# Patient Record
Sex: Female | Born: 1950 | Race: White | Hispanic: No | Smoking: Former smoker
Health system: Southern US, Community
[De-identification: ages and names within clinical notes are randomized; demographics above are authoritative.]

## PROBLEM LIST (undated history)

## (undated) DIAGNOSIS — C801 Malignant (primary) neoplasm, unspecified: Secondary | ICD-10-CM

## (undated) DIAGNOSIS — E785 Hyperlipidemia, unspecified: Secondary | ICD-10-CM

## (undated) DIAGNOSIS — R570 Cardiogenic shock: Secondary | ICD-10-CM

## (undated) DIAGNOSIS — Z8543 Personal history of malignant neoplasm of ovary: Secondary | ICD-10-CM

## (undated) DIAGNOSIS — I5032 Chronic diastolic (congestive) heart failure: Secondary | ICD-10-CM

## (undated) DIAGNOSIS — I2510 Atherosclerotic heart disease of native coronary artery without angina pectoris: Secondary | ICD-10-CM

## (undated) DIAGNOSIS — N183 Chronic kidney disease, stage 3 unspecified: Secondary | ICD-10-CM

## (undated) DIAGNOSIS — J449 Chronic obstructive pulmonary disease, unspecified: Secondary | ICD-10-CM

## (undated) DIAGNOSIS — I714 Abdominal aortic aneurysm, without rupture, unspecified: Secondary | ICD-10-CM

## (undated) DIAGNOSIS — I219 Acute myocardial infarction, unspecified: Secondary | ICD-10-CM

## (undated) DIAGNOSIS — I10 Essential (primary) hypertension: Secondary | ICD-10-CM

## (undated) HISTORY — DX: Personal history of malignant neoplasm of ovary: Z85.43

## (undated) HISTORY — DX: Chronic obstructive pulmonary disease, unspecified: J44.9

## (undated) HISTORY — DX: Acute myocardial infarction, unspecified: I21.9

## (undated) HISTORY — DX: Abdominal aortic aneurysm, without rupture, unspecified: I71.40

## (undated) HISTORY — DX: Abdominal aortic aneurysm, without rupture: I71.4

## (undated) HISTORY — DX: Cardiogenic shock: R57.0

## (undated) HISTORY — DX: Hyperlipidemia, unspecified: E78.5

## (undated) HISTORY — DX: Essential (primary) hypertension: I10

## (undated) HISTORY — DX: Atherosclerotic heart disease of native coronary artery without angina pectoris: I25.10

---

## 1988-11-14 DIAGNOSIS — Z8543 Personal history of malignant neoplasm of ovary: Secondary | ICD-10-CM

## 1988-11-14 HISTORY — PX: TOTAL ABDOMINAL HYSTERECTOMY W/ BILATERAL SALPINGOOPHORECTOMY: SHX83

## 1988-11-14 HISTORY — DX: Personal history of malignant neoplasm of ovary: Z85.43

## 1999-04-17 ENCOUNTER — Emergency Department (HOSPITAL_COMMUNITY): Admission: EM | Admit: 1999-04-17 | Discharge: 1999-04-17 | Payer: Self-pay | Admitting: Emergency Medicine

## 2000-05-12 ENCOUNTER — Encounter: Payer: Self-pay | Admitting: Family Medicine

## 2000-05-12 ENCOUNTER — Inpatient Hospital Stay (HOSPITAL_COMMUNITY): Admission: EM | Admit: 2000-05-12 | Discharge: 2000-05-15 | Payer: Self-pay | Admitting: Internal Medicine

## 2002-11-14 HISTORY — PX: CARDIAC CATHETERIZATION: SHX172

## 2003-02-04 ENCOUNTER — Emergency Department (HOSPITAL_COMMUNITY): Admission: EM | Admit: 2003-02-04 | Discharge: 2003-02-04 | Payer: Self-pay | Admitting: Emergency Medicine

## 2003-02-04 ENCOUNTER — Encounter: Payer: Self-pay | Admitting: Emergency Medicine

## 2003-11-02 ENCOUNTER — Emergency Department (HOSPITAL_COMMUNITY): Admission: EM | Admit: 2003-11-02 | Discharge: 2003-11-02 | Payer: Self-pay | Admitting: *Deleted

## 2004-04-18 ENCOUNTER — Other Ambulatory Visit: Payer: Self-pay

## 2004-11-14 HISTORY — PX: CARDIAC CATHETERIZATION: SHX172

## 2013-04-14 ENCOUNTER — Emergency Department: Payer: Self-pay | Admitting: Emergency Medicine

## 2013-09-17 ENCOUNTER — Emergency Department: Payer: Self-pay | Admitting: Emergency Medicine

## 2014-02-12 HISTORY — PX: CARDIAC CATHETERIZATION: SHX172

## 2014-02-19 ENCOUNTER — Inpatient Hospital Stay: Payer: Self-pay | Admitting: Internal Medicine

## 2014-02-19 LAB — CBC
HCT: 38.2 % (ref 35.0–47.0)
HGB: 13.1 g/dL (ref 12.0–16.0)
MCH: 31.5 pg (ref 26.0–34.0)
MCHC: 34.2 g/dL (ref 32.0–36.0)
MCV: 92 fL (ref 80–100)
PLATELETS: 170 10*3/uL (ref 150–440)
RBC: 4.14 10*6/uL (ref 3.80–5.20)
RDW: 13.7 % (ref 11.5–14.5)
WBC: 6.8 10*3/uL (ref 3.6–11.0)

## 2014-02-19 LAB — URINALYSIS, COMPLETE
Bacteria: NONE SEEN
Bilirubin,UR: NEGATIVE
Blood: NEGATIVE
GLUCOSE, UR: NEGATIVE mg/dL (ref 0–75)
Ketone: NEGATIVE
Leukocyte Esterase: NEGATIVE
Nitrite: NEGATIVE
Ph: 5 (ref 4.5–8.0)
Protein: NEGATIVE
RBC,UR: 3 /HPF (ref 0–5)
SPECIFIC GRAVITY: 1.02 (ref 1.003–1.030)
Squamous Epithelial: 2

## 2014-02-19 LAB — BASIC METABOLIC PANEL
Anion Gap: 4 — ABNORMAL LOW (ref 7–16)
BUN: 49 mg/dL — ABNORMAL HIGH (ref 7–18)
Calcium, Total: 9.1 mg/dL (ref 8.5–10.1)
Chloride: 108 mmol/L — ABNORMAL HIGH (ref 98–107)
Co2: 27 mmol/L (ref 21–32)
Creatinine: 2.31 mg/dL — ABNORMAL HIGH (ref 0.60–1.30)
EGFR (African American): 26 — ABNORMAL LOW
GFR CALC NON AF AMER: 22 — AB
Glucose: 120 mg/dL — ABNORMAL HIGH (ref 65–99)
Osmolality: 292 (ref 275–301)
POTASSIUM: 3.3 mmol/L — AB (ref 3.5–5.1)
Sodium: 139 mmol/L (ref 136–145)

## 2014-02-19 LAB — CK TOTAL AND CKMB (NOT AT ARMC)
CK, Total: 128 U/L
CK, Total: 132 U/L
CK, Total: 125 U/L
CK-MB: 2.1 ng/mL (ref 0.5–3.6)
CK-MB: 2.3 ng/mL (ref 0.5–3.6)
CK-MB: 2 ng/mL (ref 0.5–3.6)

## 2014-02-19 LAB — PRO B NATRIURETIC PEPTIDE: B-TYPE NATIURETIC PEPTID: 1273 pg/mL — AB (ref 0–125)

## 2014-02-19 LAB — TROPONIN I: Troponin-I: <0.02 ng/mL

## 2014-02-20 LAB — BASIC METABOLIC PANEL
ANION GAP: 5 — AB (ref 7–16)
BUN: 42 mg/dL — ABNORMAL HIGH (ref 7–18)
CHLORIDE: 110 mmol/L — AB (ref 98–107)
CO2: 24 mmol/L (ref 21–32)
CREATININE: 2.14 mg/dL — AB (ref 0.60–1.30)
Calcium, Total: 9.4 mg/dL (ref 8.5–10.1)
EGFR (Non-African Amer.): 24 — ABNORMAL LOW
GFR CALC AF AMER: 28 — AB
Glucose: 163 mg/dL — ABNORMAL HIGH (ref 65–99)
Osmolality: 292 (ref 275–301)
POTASSIUM: 3.9 mmol/L (ref 3.5–5.1)
Sodium: 139 mmol/L (ref 136–145)

## 2014-02-20 LAB — CBC WITH DIFFERENTIAL/PLATELET
BASOS ABS: 0 10*3/uL (ref 0.0–0.1)
Basophil %: 0 %
EOS ABS: 0 10*3/uL (ref 0.0–0.7)
Eosinophil %: 0.1 %
HCT: 36.8 % (ref 35.0–47.0)
HGB: 12.3 g/dL (ref 12.0–16.0)
Lymphocyte #: 0.7 10*3/uL — ABNORMAL LOW (ref 1.0–3.6)
Lymphocyte %: 4.7 %
MCH: 30.6 pg (ref 26.0–34.0)
MCHC: 33.5 g/dL (ref 32.0–36.0)
MCV: 91 fL (ref 80–100)
MONOS PCT: 1.2 %
Monocyte #: 0.2 x10 3/mm (ref 0.2–0.9)
Neutrophil #: 13.4 10*3/uL — ABNORMAL HIGH (ref 1.4–6.5)
Neutrophil %: 94 %
Platelet: 161 10*3/uL (ref 150–440)
RBC: 4.04 10*6/uL (ref 3.80–5.20)
RDW: 13.9 % (ref 11.5–14.5)
WBC: 14.3 10*3/uL — ABNORMAL HIGH (ref 3.6–11.0)

## 2014-02-20 LAB — MAGNESIUM: Magnesium: 1.6 mg/dL — ABNORMAL LOW

## 2014-02-24 LAB — CULTURE, BLOOD (SINGLE)

## 2014-03-06 ENCOUNTER — Inpatient Hospital Stay: Payer: Self-pay | Admitting: Student

## 2014-03-06 DIAGNOSIS — I2119 ST elevation (STEMI) myocardial infarction involving other coronary artery of inferior wall: Secondary | ICD-10-CM

## 2014-03-06 DIAGNOSIS — I5032 Chronic diastolic (congestive) heart failure: Secondary | ICD-10-CM

## 2014-03-06 DIAGNOSIS — N189 Chronic kidney disease, unspecified: Secondary | ICD-10-CM

## 2014-03-06 DIAGNOSIS — I2510 Atherosclerotic heart disease of native coronary artery without angina pectoris: Secondary | ICD-10-CM

## 2014-03-06 DIAGNOSIS — I519 Heart disease, unspecified: Secondary | ICD-10-CM

## 2014-03-06 LAB — CBC
HCT: 41.3 % (ref 35.0–47.0)
HGB: 13.8 g/dL (ref 12.0–16.0)
MCH: 30.7 pg (ref 26.0–34.0)
MCHC: 33.3 g/dL (ref 32.0–36.0)
MCV: 92 fL (ref 80–100)
Platelet: 210 10*3/uL (ref 150–440)
RBC: 4.48 10*6/uL (ref 3.80–5.20)
RDW: 14.7 % — ABNORMAL HIGH (ref 11.5–14.5)
WBC: 17.9 10*3/uL — ABNORMAL HIGH (ref 3.6–11.0)

## 2014-03-06 LAB — APTT: ACTIVATED PTT: 24 s (ref 23.6–35.9)

## 2014-03-06 LAB — COMPREHENSIVE METABOLIC PANEL
ALK PHOS: 61 U/L
Albumin: 3.6 g/dL (ref 3.4–5.0)
Anion Gap: 11 (ref 7–16)
BUN: 32 mg/dL — AB (ref 7–18)
Bilirubin,Total: 0.5 mg/dL (ref 0.2–1.0)
CO2: 22 mmol/L (ref 21–32)
Calcium, Total: 8.4 mg/dL — ABNORMAL LOW (ref 8.5–10.1)
Chloride: 105 mmol/L (ref 98–107)
Creatinine: 1.83 mg/dL — ABNORMAL HIGH (ref 0.60–1.30)
EGFR (African American): 34 — ABNORMAL LOW
GFR CALC NON AF AMER: 29 — AB
GLUCOSE: 148 mg/dL — AB (ref 65–99)
OSMOLALITY: 285 (ref 275–301)
POTASSIUM: 3.1 mmol/L — AB (ref 3.5–5.1)
SGOT(AST): 20 U/L (ref 15–37)
SGPT (ALT): 22 U/L (ref 12–78)
Sodium: 138 mmol/L (ref 136–145)
TOTAL PROTEIN: 6.7 g/dL (ref 6.4–8.2)

## 2014-03-06 LAB — PRO B NATRIURETIC PEPTIDE: B-TYPE NATIURETIC PEPTID: 818 pg/mL — AB (ref 0–125)

## 2014-03-06 LAB — PROTIME-INR
INR: 1
PROTHROMBIN TIME: 12.7 s (ref 11.5–14.7)

## 2014-03-06 LAB — TROPONIN I: Troponin-I: 0.17 ng/mL — ABNORMAL HIGH

## 2014-03-06 LAB — CK TOTAL AND CKMB (NOT AT ARMC)
CK, Total: 58 U/L
CK, Total: 1498 U/L — ABNORMAL HIGH
CK-MB: 2.1 ng/mL (ref 0.5–3.6)
CK-MB: 292.6 ng/mL — ABNORMAL HIGH (ref 0.5–3.6)

## 2014-03-07 ENCOUNTER — Encounter: Payer: Self-pay | Admitting: Cardiovascular Disease

## 2014-03-07 DIAGNOSIS — N179 Acute kidney failure, unspecified: Secondary | ICD-10-CM

## 2014-03-07 DIAGNOSIS — I2109 ST elevation (STEMI) myocardial infarction involving other coronary artery of anterior wall: Secondary | ICD-10-CM

## 2014-03-07 LAB — CBC WITH DIFFERENTIAL/PLATELET
Basophil #: 0.1 10*3/uL
Basophil %: 0.7 %
Eosinophil #: 0.1 10*3/uL
Eosinophil %: 0.6 %
HCT: 37.4 %
HGB: 12.3 g/dL
Lymphocyte %: 8 %
Lymphs Abs: 1 10*3/uL
MCH: 30.9 pg
MCHC: 32.9 g/dL
MCV: 94 fL
Monocyte #: 0.9 10*3/uL
Monocyte %: 6.9 %
Neutrophil #: 10.6 10*3/uL — ABNORMAL HIGH
Neutrophil %: 83.8 %
Platelet: 161 10*3/uL
RBC: 3.98 X10 6/mm 3
RDW: 14.5 %
WBC: 12.6 10*3/uL — ABNORMAL HIGH

## 2014-03-07 LAB — BASIC METABOLIC PANEL
Anion Gap: 5 — ABNORMAL LOW (ref 7–16)
BUN: 18 mg/dL (ref 7–18)
CALCIUM: 8.1 mg/dL — AB (ref 8.5–10.1)
CO2: 24 mmol/L (ref 21–32)
Chloride: 110 mmol/L — ABNORMAL HIGH (ref 98–107)
Creatinine: 1.32 mg/dL — ABNORMAL HIGH (ref 0.60–1.30)
EGFR (African American): 51 — ABNORMAL LOW
EGFR (Non-African Amer.): 44 — ABNORMAL LOW
GLUCOSE: 134 mg/dL — AB (ref 65–99)
OSMOLALITY: 281 (ref 275–301)
POTASSIUM: 3.9 mmol/L (ref 3.5–5.1)
Sodium: 139 mmol/L (ref 136–145)

## 2014-03-07 LAB — LIPID PANEL
Cholesterol: 125 mg/dL (ref 0–200)
HDL Cholesterol: 37 mg/dL — ABNORMAL LOW (ref 40–60)
Ldl Cholesterol, Calc: 63 mg/dL (ref 0–100)
Triglycerides: 125 mg/dL (ref 0–200)
VLDL CHOLESTEROL, CALC: 25 mg/dL (ref 5–40)

## 2014-03-07 LAB — TSH: Thyroid Stimulating Horm: 0.3 u[IU]/mL — ABNORMAL LOW

## 2014-03-08 LAB — BASIC METABOLIC PANEL
Anion Gap: 4 — ABNORMAL LOW (ref 7–16)
BUN: 21 mg/dL — AB (ref 7–18)
CALCIUM: 8.7 mg/dL (ref 8.5–10.1)
CHLORIDE: 110 mmol/L — AB (ref 98–107)
Co2: 26 mmol/L (ref 21–32)
Creatinine: 1.24 mg/dL (ref 0.60–1.30)
EGFR (Non-African Amer.): 47 — ABNORMAL LOW
GFR CALC AF AMER: 55 — AB
Glucose: 137 mg/dL — ABNORMAL HIGH (ref 65–99)
Osmolality: 285 (ref 275–301)
Potassium: 3.8 mmol/L (ref 3.5–5.1)
Sodium: 140 mmol/L (ref 136–145)

## 2014-03-08 LAB — CK: CK, Total: 248 U/L — ABNORMAL HIGH

## 2014-03-18 ENCOUNTER — Encounter: Payer: Self-pay | Admitting: Cardiovascular Disease

## 2014-03-18 ENCOUNTER — Ambulatory Visit (INDEPENDENT_AMBULATORY_CARE_PROVIDER_SITE_OTHER): Payer: Commercial Managed Care - HMO | Admitting: Cardiovascular Disease

## 2014-03-18 VITALS — BP 119/87 | HR 74 | Ht 62.5 in | Wt 170.8 lb

## 2014-03-18 DIAGNOSIS — Z7689 Persons encountering health services in other specified circumstances: Secondary | ICD-10-CM

## 2014-03-18 DIAGNOSIS — I10 Essential (primary) hypertension: Secondary | ICD-10-CM | POA: Insufficient documentation

## 2014-03-18 DIAGNOSIS — Z7189 Other specified counseling: Secondary | ICD-10-CM

## 2014-03-18 DIAGNOSIS — R0789 Other chest pain: Secondary | ICD-10-CM

## 2014-03-18 DIAGNOSIS — E785 Hyperlipidemia, unspecified: Secondary | ICD-10-CM

## 2014-03-18 DIAGNOSIS — I2510 Atherosclerotic heart disease of native coronary artery without angina pectoris: Secondary | ICD-10-CM | POA: Insufficient documentation

## 2014-03-18 MED ORDER — METOPROLOL SUCCINATE ER 25 MG PO TB24: 25.0000 mg | ORAL_TABLET | Freq: Every day | ORAL | Status: DC

## 2014-03-18 MED ORDER — TICAGRELOR 90 MG PO TABS: 90.0000 mg | ORAL_TABLET | Freq: Two times a day (BID) | ORAL | Status: DC

## 2014-03-18 MED ORDER — GABAPENTIN 300 MG PO CAPS: 300.0000 mg | ORAL_CAPSULE | Freq: Three times a day (TID) | ORAL | Status: DC

## 2014-03-18 NOTE — Patient Instructions (Signed)
Your physician has recommended you make the following change in your medication:  Gabapentin has been changed to 300 mg twice daily   You have been referred to Cardiac Rehab   You have been referred to Charolette Forward NP for primary care   Your physician recommends that you schedule a follow-up appointment in:  3 months   Please call me if you do not get an appt time in one week for your referrals.

## 2014-03-18 NOTE — Progress Notes (Signed)
HPI  This is a pleasant 63-year-old female who is here today for a followup visit after recent hospitalization at [facility]. She has known history of coronary artery disease status post stenting of the LAD, left circumflex and RCA at [facility]. She also has chronic medical conditions that include hypertension, hyperlipidemia, previous tobacco use and restless leg syndrome. There is strong family history of coronary artery disease. She presented on April 24 with acute inferior ST elevation myocardial infarction. I proceeded with emergent cardiac catheterization which showed an occluded mid RCA just distal to the previously placed stent. I performed angioplasty and drug-eluting stent placement x3 to the RCA due to presence of 70% proximal and distal stenosis as well. The stents in the LAD and left circumflex were patent. Ejection fraction was 50-55% by echo. Creatinine was high on presentation but actually improved with hydration. She was hospitalized a week before her MI due to pneumonia and acute renal failure. She had mild hypotension after her myocardial infarction which required dopamine. She gradually improved and was discharged home. She has been doing very well and denies any chest pain. She was switched from Plavix to Brilinta. She noticed dyspnea which he takes his medication. She used to take Lasix daily for leg edema.  No Known Allergies   No current outpatient prescriptions on file prior to visit.   No current facility-administered medications on file prior to visit.     Past Medical History  Diagnosis Date  . MI (myocardial infarction)   . CHF (congestive heart failure)   . Acute on chronic renal failure   . Cardiogenic shock   . COPD (chronic obstructive pulmonary disease)   . H/O ovarian cancer   . AAA (abdominal aortic aneurysm)   . Coronary artery disease     Previous stenting of LAD, LCx and RCA at [facility]. She presented on 03/06/2014 with acute inferior ST elevation myocardial  infarction. Cardiac catheterization showed patent stents in the LAD and left circumflex with occluded mid RCA just distal to the previously placed stent. There was also 70% proximal and distal stenosis. She underwent angioplasty and drug-eluting stent placement x3 to the RCA.   Marland Kitchen Hyperlipidemia   . Hypertension      Past Surgical History  Procedure Laterality Date  . Cardiac catheterization  2004    [facility];x1 stent  . Cardiac catheterization  2006    [facility];x2 stent  . Cardiac catheterization  02/2014    [facility];x3 stent  . Total abdominal hysterectomy       Family History  Problem Relation Age of Onset  . Heart disease Mother   . Heart attack Mother   . Heart disease Maternal Aunt   . Heart disease Maternal Aunt   . Heart disease Maternal Aunt   . Heart disease Maternal Aunt   . Heart disease Maternal Aunt      History   Social History  . Marital Status: Married    Spouse Name: N/A    Number of Children: N/A  . Years of Education: N/A   Occupational History  . Not on file.   Social History Main Topics  . Smoking status: Former Smoker -- 0.25 packs/day for 20 years    Types: Cigarettes  . Smokeless tobacco: Not on file  . Alcohol Use: Yes     Comment: occasional  . Drug Use: No  . Sexual Activity: Not on file   Other Topics Concern  . Not on file   Social History Narrative  .  No narrative on file      PHYSICAL EXAM   BP 119/87  Pulse 74  Ht 5' 2.5" (1.588 m)  Wt 170 lb 12 oz (77.452 kg)  BMI 30.71 kg/m2 Constitutional: She is oriented to person, place, and time. She appears well-developed and well-nourished. No distress.  HENT: No nasal discharge.  Head: Normocephalic and atraumatic.  Eyes: Pupils are equal and round. No discharge.  Neck: Normal range of motion. Neck supple. No JVD present. No thyromegaly present.  Cardiovascular: Normal rate, regular rhythm, normal heart sounds. Exam reveals no gallop and no friction rub. No murmur heard.    Pulmonary/Chest: Effort normal and breath sounds normal. No stridor. No respiratory distress. She has no wheezes. She has no rales. She exhibits no tenderness.  Abdominal: Soft. Bowel sounds are normal. She exhibits no distension. There is no tenderness. There is no rebound and no guarding.  Musculoskeletal: Normal range of motion. She exhibits no edema and no tenderness.  Neurological: She is alert and oriented to person, place, and time. Coordination normal.  Skin: Skin is warm and dry. No rash noted. She is not diaphoretic. No erythema. No pallor.  Psychiatric: She has a normal mood and affect. Her behavior is normal. Judgment and thought content normal.  Right groin: No hematoma or bruit   FFM:BWGYK  Rhythm  Low voltage in precordial leads.   ABNORMAL     ASSESSMENT AND PLAN

## 2014-03-18 NOTE — Assessment & Plan Note (Signed)
She is doing very well with no symptoms of recurrent angina. Continue medical therapy. Dyspnea is likely related to Brilinta. I am planning to treat her with this for at least another few months and then switching her back to Plavix indefinitely due to multiple cardiac stents. I referred her to cardiac rehabilitation at [facility]

## 2014-03-18 NOTE — Assessment & Plan Note (Signed)
Continue high dose atorvastatin. Lipid profile was optimal and was checked during her hospitalization.

## 2014-03-18 NOTE — Assessment & Plan Note (Signed)
Blood pressure is well controlled on current medications. 

## 2014-03-19 ENCOUNTER — Telehealth: Payer: Self-pay | Admitting: *Deleted

## 2014-03-19 ENCOUNTER — Telehealth: Payer: Self-pay

## 2014-03-19 NOTE — Telephone Encounter (Signed)
Pt was in yesterday and needs a note for work. Please call.

## 2014-03-19 NOTE — Telephone Encounter (Signed)
Patient asked if she can have a note to return to work with no restrictions.

## 2014-03-19 NOTE — Telephone Encounter (Signed)
Pt called back asking about work note  Informed her I would call her tomorrow

## 2014-03-19 NOTE — Telephone Encounter (Signed)
Informed patient that she has appt with Raquel Rey NP 04/10/14 at 10am  I informed patient that her info was sent to Cardiac Rehab at [facility] and they should be calling to set up appt  Told her to call me if they did not call for appt  Patient verbalized understanding   Cardiac rehab orders faxed to [facility]

## 2014-03-20 ENCOUNTER — Telehealth: Payer: Self-pay | Admitting: *Deleted

## 2014-03-20 ENCOUNTER — Encounter: Payer: Self-pay | Admitting: *Deleted

## 2014-03-20 NOTE — Telephone Encounter (Signed)
Note to return to work faxed to [phone] Patient is aware

## 2014-03-20 NOTE — Telephone Encounter (Signed)
Patient needs note stating she can return to work with no restrictions.

## 2014-03-20 NOTE — Telephone Encounter (Signed)
Patient needs a note that she can return to work. Please call when ready.

## 2014-03-20 NOTE — Telephone Encounter (Signed)
She can resume work on 5/11 with no restrictions unless her job is very physical.

## 2014-03-21 ENCOUNTER — Encounter: Payer: Commercial Managed Care - HMO | Admitting: Cardiovascular Disease

## 2014-03-25 ENCOUNTER — Telehealth: Payer: Self-pay | Admitting: *Deleted

## 2014-03-25 NOTE — Telephone Encounter (Signed)
Orders and records faxed to Cardiac Rehab

## 2014-03-26 ENCOUNTER — Telehealth: Payer: Self-pay

## 2014-03-26 NOTE — Telephone Encounter (Signed)
Pt called and states she still has not heard anything from cardiac rehab. Please call.

## 2014-03-26 NOTE — Telephone Encounter (Signed)
Informed patient that referral was faxed to cardiac rehab yesterday

## 2014-04-10 ENCOUNTER — Encounter: Payer: Self-pay | Admitting: Adult Health

## 2014-04-10 ENCOUNTER — Ambulatory Visit (INDEPENDENT_AMBULATORY_CARE_PROVIDER_SITE_OTHER): Payer: Medicare PPO | Admitting: Adult Health

## 2014-04-10 VITALS — BP 124/80 | HR 68 | Temp 98.4°F | Resp 14 | Ht 62.0 in | Wt 166.0 lb

## 2014-04-10 DIAGNOSIS — Z79899 Other long term (current) drug therapy: Secondary | ICD-10-CM

## 2014-04-10 MED ORDER — FUROSEMIDE 40 MG PO TABS: 40.0000 mg | ORAL_TABLET | ORAL | Status: DC | PRN

## 2014-04-10 MED ORDER — FLUOXETINE HCL 40 MG PO CAPS: 40.0000 mg | ORAL_CAPSULE | Freq: Every day | ORAL | Status: DC

## 2014-04-10 MED ORDER — GABAPENTIN 300 MG PO CAPS: 300.0000 mg | ORAL_CAPSULE | Freq: Three times a day (TID) | ORAL | Status: DC

## 2014-04-10 MED ORDER — LOSARTAN POTASSIUM 25 MG PO TABS: 25.0000 mg | ORAL_TABLET | Freq: Every day | ORAL | Status: DC

## 2014-04-10 MED ORDER — TRAMADOL HCL 50 MG PO TABS: 50.0000 mg | ORAL_TABLET | Freq: Three times a day (TID) | ORAL | Status: DC | PRN

## 2014-04-10 MED ORDER — ATORVASTATIN CALCIUM 80 MG PO TABS: 80.0000 mg | ORAL_TABLET | Freq: Every day | ORAL | Status: DC

## 2014-04-10 NOTE — Progress Notes (Signed)
Pre visit review using our clinic review tool, if applicable. No additional management support is needed unless otherwise documented below in the visit note. 

## 2014-04-10 NOTE — Progress Notes (Signed)
Subjective:    Patient ID: Olivia Fields, female    DOB: 1951-06-10, 63 y.o.   MRN: [id number]  HPI Pt is a 63 year old female with multiple medical problems including HTN, HLD, CAD, MI x 3 s/p 8 stents, AAA, who presents today to establish care. She is being followed by Dr. Fletcher Anon who has referred her to our clinic. Pt Indicates that she is feeling well and currently with no concerns. She does report that she needs refills on her medications.    Past Medical History  Diagnosis Date  . MI (myocardial infarction) 2002, 2009, 2015  . CHF (congestive heart failure)   . Acute on chronic renal failure   . Cardiogenic shock   . COPD (chronic obstructive pulmonary disease)   . H/O ovarian cancer 1990  . AAA (abdominal aortic aneurysm)   . Coronary artery disease     Previous stenting of LAD, LCx and RCA at [facility]. She presented on 03/06/2014 with acute inferior ST elevation myocardial infarction. Cardiac catheterization showed patent stents in the LAD and left circumflex with occluded mid RCA just distal to the previously placed stent. There was also 70% proximal and distal stenosis. She underwent angioplasty and drug-eluting stent placement x3 to the RCA.   Marland Kitchen Hyperlipidemia   . Hypertension      Past Surgical History  Procedure Laterality Date  . Cardiac catheterization  2004    [facility];x1 stent  . Cardiac catheterization  2006    [facility];x2 stent  . Cardiac catheterization  02/2014    [facility];x3 stent  . Total abdominal hysterectomy w/ bilateral salpingoophorectomy  1990    ovarian cancer     Family History  Problem Relation Age of Onset  . Heart disease Mother   . Heart attack Mother   . Diabetes Mother   . Cancer Mother     Tonsils/Throat  . Heart disease Maternal Aunt   . Heart disease Maternal Aunt   . Heart disease Maternal Aunt   . Heart disease Maternal Aunt   . Heart disease Maternal Aunt   . Cancer Father     Lung  . Aneurysm Father      History   Social History    . Marital Status: Divorced    Spouse Name: N/A    Number of Children: 2  . Years of Education: 16   Occupational History  . Accounting     Retired   Social History Main Topics  . Smoking status: Former Smoker -- 0.25 packs/day for 20 years    Types: Cigarettes    Quit date: 03/11/2010  . Smokeless tobacco: Not on file  . Alcohol Use: Yes     Comment: occasional  . Drug Use: No  . Sexual Activity: Not on file   Other Topics Concern  . Not on file   Social History Narrative   Born and raised in [place], [place]. Works part-time at [employer]. Currrently resides in a house with daughter Anderson Malta, age 33). 28 year old daughter in [place] [facility]). Has 5 granddaughters Vernie Shanks, [place], [place], [place]). Has a cat (Zoe). Active in church, enjoys shopping and dinner with granddaughter. Starting cardiac rehab 04/11/2014. Walks for exercise.      Current Outpatient Prescriptions on File Prior to Visit  Medication Sig Dispense Refill  . aspirin 81 MG tablet Take 81 mg by mouth daily.      Marland Kitchen atorvastatin (LIPITOR) 80 MG tablet Take 80 mg by mouth daily.      Marland Kitchen  cetirizine (ZYRTEC) 10 MG tablet Take 10 mg by mouth daily.      . fluticasone (FLONASE) 50 MCG/ACT nasal spray Place 1 spray into both nostrils daily.      Marland Kitchen gabapentin (NEURONTIN) 300 MG capsule Take 1 capsule (300 mg total) by mouth 3 (three) times daily.  60 capsule  6  . Ipratropium-Albuterol (COMBIVENT) 20-100 MCG/ACT AERS respimat Inhale 1 puff into the lungs as needed for wheezing.      Marland Kitchen losartan (COZAAR) 25 MG tablet Take 25 mg by mouth daily.      . metoprolol succinate (TOPROL-XL) 25 MG 24 hr tablet Take 1 tablet (25 mg total) by mouth daily.  30 tablet  6  . nitroGLYCERIN (NITROSTAT) 0.4 MG SL tablet Place 0.4 mg under the tongue every 5 (five) minutes as needed for chest pain.      . ranitidine (ZANTAC) 150 MG tablet Take 150 mg by mouth 2 (two) times daily.      . ticagrelor (BRILINTA) 90 MG TABS tablet  Take 1 tablet (90 mg total) by mouth every 12 (twelve) hours.  60 tablet  6  . tiotropium (SPIRIVA) 18 MCG inhalation capsule Place 18 mcg into inhaler and inhale daily.       No current facility-administered medications on file prior to visit.     Review of Systems  Constitutional: Negative.   HENT: Negative.   Eyes: Negative.   Respiratory: Negative.   Cardiovascular: Negative.   Gastrointestinal: Negative.   Endocrine: Negative.   Genitourinary: Negative.   Musculoskeletal: Negative.   Skin: Negative.   Allergic/Immunologic: Negative.   Neurological: Negative.   Hematological: Negative.   Psychiatric/Behavioral: Negative.        Objective:  BP 124/80  Pulse 68  Temp(Src) 98.4 F (36.9 C) (Oral)  Resp 14  Ht 5\' 2"  (1.575 m)  Wt 166 lb (75.297 kg)  BMI 30.35 kg/m2  SpO2 98%   Physical Exam  Constitutional: She is oriented to person, place, and time. No distress.  HENT:  Head: Normocephalic and atraumatic.  Eyes: Conjunctivae and EOM are normal.  Neck: Normal range of motion. Neck supple.  Cardiovascular: Normal rate, regular rhythm, normal heart sounds and intact distal pulses.  Exam reveals no gallop and no friction rub.   No murmur heard. Pulmonary/Chest: Effort normal and breath sounds normal. No respiratory distress. She has no wheezes. She has no rales.  Musculoskeletal: Normal range of motion.  Neurological: She is alert and oriented to person, place, and time. She has normal reflexes. Coordination normal.  Skin: Skin is warm and dry.  Psychiatric: She has a normal mood and affect. Her behavior is normal. Judgment and thought content normal.      Assessment & Plan:   1. Medication management All medications were reviewed in detail. She reports compliance with all her medications. Refills sent to her pharmacy. She will return in August for her Annual physical exam.

## 2014-04-10 NOTE — Patient Instructions (Signed)
  Schedule your annual physical for August.  You will need to be fasting.  I have sent refills for your medications in to your pharmacy.  Please call with any questions or concerns.  Remember to set up your MyChart account. The instructions are at the end of this form.

## 2014-05-28 ENCOUNTER — Encounter (INDEPENDENT_AMBULATORY_CARE_PROVIDER_SITE_OTHER): Payer: Self-pay

## 2014-05-28 ENCOUNTER — Telehealth: Payer: Self-pay | Admitting: Adult Health

## 2014-05-28 NOTE — Telephone Encounter (Signed)
Pt came in and stated needing a letter from [employer] for [employer]. States she needs one every year since she has COPD so she can have air condition. Would like a call back stating when it is ready.

## 2014-05-28 NOTE — Telephone Encounter (Signed)
Spoke with pt, she states she needs this letter with her diagnosis as soon as possible.  Pt is aware that Raquel is out of the office.  Please advise in Raquels absence.

## 2014-05-28 NOTE — Telephone Encounter (Signed)
Patient does not have COPD as a documented diagnosis in chart.  She will have to wait until Raquel can do it since I have not evaluated patient.

## 2014-05-31 NOTE — Telephone Encounter (Signed)
Please call pt and inquire about what needs to be included in this letter and go ahead and write it. I will sign it. Thanks

## 2014-06-02 ENCOUNTER — Encounter: Payer: Self-pay | Admitting: Adult Health

## 2014-06-02 ENCOUNTER — Ambulatory Visit (INDEPENDENT_AMBULATORY_CARE_PROVIDER_SITE_OTHER): Payer: Medicare PPO | Admitting: Adult Health

## 2014-06-02 ENCOUNTER — Encounter: Payer: Self-pay | Admitting: *Deleted

## 2014-06-02 ENCOUNTER — Telehealth: Payer: Self-pay | Admitting: *Deleted

## 2014-06-02 VITALS — BP 120/78 | HR 70 | Temp 98.5°F | Resp 14 | Wt 160.8 lb

## 2014-06-02 DIAGNOSIS — H9209 Otalgia, unspecified ear: Secondary | ICD-10-CM | POA: Insufficient documentation

## 2014-06-02 DIAGNOSIS — H9202 Otalgia, left ear: Secondary | ICD-10-CM

## 2014-06-02 MED ORDER — CIPROFLOXACIN HCL 0.2 % OT SOLN: 0.2000 mL | Freq: Two times a day (BID) | OTIC | Status: DC

## 2014-06-02 NOTE — Telephone Encounter (Signed)
Sent generic alternative

## 2014-06-02 NOTE — Telephone Encounter (Signed)
Letter written and printed as requested

## 2014-06-02 NOTE — Telephone Encounter (Signed)
Please refer to pervious note from Raquel.

## 2014-06-02 NOTE — Progress Notes (Signed)
Pre visit review using our clinic review tool, if applicable. No additional management support is needed unless otherwise documented below in the visit note. 

## 2014-06-02 NOTE — Telephone Encounter (Signed)
Fax from pharmacy, stating Ciprofloxacin 0.2% Otic sol not covered by insurance, needs generic alternative

## 2014-06-02 NOTE — Patient Instructions (Signed)
Start Cipro ear drops in the left ear 2 times daily for 7 days.  Please call if no improvement within 4-5 days.

## 2014-06-02 NOTE — Progress Notes (Signed)
Patient ID: Olivia Fields, female   DOB: 07/26/1951, 63 y.o.   MRN: [id number]   Subjective:    Patient ID: Olivia Fields, female    DOB: 19-Sep-1951, 63 y.o.   MRN: [id number]  HPI  Pt presents to clinic with left ear discomfort. She reports having hx of this problems. She has tried antibiotics, ointments without any resolution. She has dry scaling of the ear. Occasionally notices drainage from ear. No fever or chills. No changes in hearing.   Past Medical History  Diagnosis Date  . MI (myocardial infarction) 2002, 2009, 2015  . CHF (congestive heart failure)   . Acute on chronic renal failure   . Cardiogenic shock   . COPD (chronic obstructive pulmonary disease)   . H/O ovarian cancer 1990  . AAA (abdominal aortic aneurysm)   . Coronary artery disease     Previous stenting of LAD, LCx and RCA at [facility]. She presented on 03/06/2014 with acute inferior ST elevation myocardial infarction. Cardiac catheterization showed patent stents in the LAD and left circumflex with occluded mid RCA just distal to the previously placed stent. There was also 70% proximal and distal stenosis. She underwent angioplasty and drug-eluting stent placement x3 to the RCA.   Marland Kitchen Hyperlipidemia   . Hypertension     Current Outpatient Prescriptions on File Prior to Visit  Medication Sig Dispense Refill  . aspirin 81 MG tablet Take 81 mg by mouth daily.      Marland Kitchen atorvastatin (LIPITOR) 80 MG tablet Take 1 tablet (80 mg total) by mouth daily.  30 tablet  6  . cetirizine (ZYRTEC) 10 MG tablet Take 10 mg by mouth daily.      Marland Kitchen FLUoxetine (PROZAC) 40 MG capsule Take 1 capsule (40 mg total) by mouth daily.  30 capsule  6  . fluticasone (FLONASE) 50 MCG/ACT nasal spray Place 1 spray into both nostrils daily.      . furosemide (LASIX) 40 MG tablet Take 1 tablet (40 mg total) by mouth as needed for fluid or edema.  30 tablet  6  . gabapentin (NEURONTIN) 300 MG capsule Take 1 capsule (300 mg total) by mouth 3 (three)  times daily.  90 capsule  6  . Ipratropium-Albuterol (COMBIVENT) 20-100 MCG/ACT AERS respimat Inhale 1 puff into the lungs as needed for wheezing.      Marland Kitchen losartan (COZAAR) 25 MG tablet Take 1 tablet (25 mg total) by mouth daily.  30 tablet  6  . metoprolol succinate (TOPROL-XL) 25 MG 24 hr tablet Take 1 tablet (25 mg total) by mouth daily.  30 tablet  6  . nitroGLYCERIN (NITROSTAT) 0.4 MG SL tablet Place 0.4 mg under the tongue every 5 (five) minutes as needed for chest pain.      . ranitidine (ZANTAC) 150 MG tablet Take 150 mg by mouth 2 (two) times daily.      . ticagrelor (BRILINTA) 90 MG TABS tablet Take 1 tablet (90 mg total) by mouth every 12 (twelve) hours.  60 tablet  6  . tiotropium (SPIRIVA) 18 MCG inhalation capsule Place 18 mcg into inhaler and inhale daily.      . traMADol (ULTRAM) 50 MG tablet Take 1 tablet (50 mg total) by mouth every 8 (eight) hours as needed.  60 tablet  5   No current facility-administered medications on file prior to visit.     Review of Systems  Constitutional: Negative for fever and chills.  HENT: Positive for ear pain (left ear  pain).   Respiratory: Negative.   Cardiovascular: Negative.   Neurological: Negative.   All other systems reviewed and are negative.      Objective:  BP 120/78  Pulse 70  Temp(Src) 98.5 F (36.9 C) (Oral)  Resp 14  Wt 160 lb 12 oz (72.916 kg)  SpO2 95%   Physical Exam  Constitutional: She is oriented to person, place, and time. She appears well-developed and well-nourished. No distress.  HENT:  Head: Normocephalic and atraumatic.  Left ear canal erythematous, dry, flaky  Cardiovascular: Normal rate and regular rhythm.   Pulmonary/Chest: Effort normal. No respiratory distress.  Neurological: She is alert and oriented to person, place, and time.  Psychiatric: She has a normal mood and affect. Her behavior is normal. Judgment and thought content normal.      Assessment & Plan:   1. Otalgia, left Otitis externa.  Cipro ear drops bid x 7 days. RTC if no improvement within 4-5 days.

## 2014-06-03 NOTE — Telephone Encounter (Signed)
Signed alternative faxed back to the pharmacy.

## 2014-06-04 ENCOUNTER — Telehealth: Payer: Self-pay | Admitting: *Deleted

## 2014-06-04 NOTE — Telephone Encounter (Signed)
Spoke with pharmacy, advised of change.  Pt aware.

## 2014-06-04 NOTE — Telephone Encounter (Signed)
Ok to change

## 2014-06-04 NOTE — Telephone Encounter (Signed)
Message from pharmacy stating Ciproflaxicin 0.2% Otic Solution is not covered by insurance, however Ciprofraxicin 0.3% opthalmic solution is.  Please advise whether to change Rx.

## 2014-06-10 ENCOUNTER — Telehealth: Payer: Self-pay | Admitting: Adult Health

## 2014-06-10 NOTE — Telephone Encounter (Signed)
Patient Information:  Caller Name: Hilda Blades  Phone: [phone]  Patient: Olivia Fields, Olivia Fields  Gender: Female  DOB: 04-05-1951  Age: 63 Years  PCP: Charolette Forward  Office Follow Up:  Does the office need to follow up with this patient?: No  Instructions For The Office: N/A  RN Note:  Patient states she developed generalized abdominal pain, onset 0800 06/10/14. Patient states she has had 22 formed bowel movements since 0800 06/10/14. Patient states she feels weak. Patient states she had a similar episode several years ago and was admitted to Intensive care " because my intestines were dying." Patient reports that her sx are the same now. Patient denies nausea, vomiting or diarrhea. Patient is afebrile. Patient states pain is severe and improves after passing a bowel movement but recurs. Denies abdominal distention.  Care advice given per guidelines. Patient advised not to have anything to eat or drink. RN spoke with Jeanine Luz, in office. States to advise Patient to go to the ED for evaluation. Patient is no longer on line. Call was dropped. Call placed to patient at [phone]. No answer. Voice mail message left for patient to return the call to the office number.  1520: Patient contacted and informed to go to the ED now for evaluation. Patient advised to change position slowly, with assistance. Patient advised not to drive self. Patient verbalizes understanding and agreeable. Patient states she will go to [facility] for evaluation.  Symptoms  Reason For Call & Symptoms: Abdominal pain  Reviewed Health History In EMR: Yes  Reviewed Medications In EMR: Yes  Reviewed Allergies In EMR: Yes  Reviewed Surgeries / Procedures: Yes  Date of Onset of Symptoms: 06/10/2014  Guideline(s) Used:  Abdominal Pain - Female  Disposition Per Guideline:   Go to ED Now  Reason For Disposition Reached:   Severe abdominal pain (e.g., excruciating)  Advice Given:  N/A

## 2014-06-10 NOTE — Telephone Encounter (Signed)
FYI

## 2014-06-10 NOTE — Telephone Encounter (Signed)
Please call pt and follow up on this. Did she go to the ED?

## 2014-06-13 NOTE — Telephone Encounter (Signed)
FYI: Spoke to patient to see how she was doing. Patient stated she was on her way to the ED when the pains stopped. Patient decided no to go to the ED. Pain has not returned. Patient stated the were gone just as fast as they started. Advised patient to go to the ED if the pain returned over weekend and call us during the week if it happens again. Patient verbalized understanding.

## 2014-06-19 ENCOUNTER — Ambulatory Visit: Payer: Commercial Managed Care - HMO | Admitting: Cardiovascular Disease

## 2014-06-25 ENCOUNTER — Other Ambulatory Visit: Payer: Self-pay | Admitting: Adult Health

## 2014-07-08 ENCOUNTER — Ambulatory Visit: Payer: Medicare PPO | Admitting: Cardiovascular Disease

## 2014-07-08 ENCOUNTER — Encounter: Payer: Medicare PPO | Admitting: Adult Health

## 2014-07-14 ENCOUNTER — Encounter: Payer: Medicare PPO | Admitting: Adult Health

## 2014-07-14 ENCOUNTER — Ambulatory Visit: Payer: Medicare PPO | Admitting: Cardiovascular Disease

## 2014-07-15 ENCOUNTER — Encounter: Payer: Medicare PPO | Admitting: Adult Health

## 2014-07-29 ENCOUNTER — Encounter: Payer: Medicare PPO | Admitting: Adult Health

## 2014-07-29 ENCOUNTER — Telehealth: Payer: Self-pay | Admitting: Adult Health

## 2014-07-29 NOTE — Telephone Encounter (Signed)
Patient called to cancel 1:30pm at 10:40am. Can patient be removed from schedule?

## 2014-07-29 NOTE — Telephone Encounter (Signed)
Pt has an appt today at 1:30 but stated she has came down with a cold this weekend and is needing to cancel. Cancel appt?

## 2014-07-30 NOTE — Telephone Encounter (Signed)
Please remove patient from the schedule from yesterday so shes not charged a no show fee.

## 2014-07-30 NOTE — Telephone Encounter (Signed)
Yes remove

## 2014-07-31 ENCOUNTER — Ambulatory Visit: Payer: Medicare PPO | Admitting: Cardiovascular Disease

## 2014-08-02 ENCOUNTER — Emergency Department: Payer: Self-pay | Admitting: Emergency Medicine

## 2014-08-02 LAB — HEPATIC FUNCTION PANEL A (ARMC)
ALK PHOS: 112 U/L
Albumin: 3.5 g/dL (ref 3.4–5.0)
Bilirubin, Direct: 0.3 mg/dL — ABNORMAL HIGH (ref 0.00–0.20)
Bilirubin,Total: 0.9 mg/dL (ref 0.2–1.0)
SGOT(AST): 42 U/L — ABNORMAL HIGH (ref 15–37)
SGPT (ALT): 23 U/L
Total Protein: 7.7 g/dL (ref 6.4–8.2)

## 2014-08-02 LAB — BASIC METABOLIC PANEL
Anion Gap: 5 — ABNORMAL LOW (ref 7–16)
BUN: 13 mg/dL (ref 7–18)
CO2: 20 mmol/L — AB (ref 21–32)
Calcium, Total: 9.4 mg/dL (ref 8.5–10.1)
Chloride: 108 mmol/L — ABNORMAL HIGH (ref 98–107)
Creatinine: 1.65 mg/dL — ABNORMAL HIGH (ref 0.60–1.30)
EGFR (African American): 39 — ABNORMAL LOW
GFR CALC NON AF AMER: 33 — AB
Glucose: 123 mg/dL — ABNORMAL HIGH (ref 65–99)
Osmolality: 268 (ref 275–301)
Potassium: 3.8 mmol/L (ref 3.5–5.1)
SODIUM: 133 mmol/L — AB (ref 136–145)

## 2014-08-02 LAB — CBC
HCT: 38.3 % (ref 35.0–47.0)
HGB: 12.6 g/dL (ref 12.0–16.0)
MCH: 30.4 pg (ref 26.0–34.0)
MCHC: 32.8 g/dL (ref 32.0–36.0)
MCV: 93 fL (ref 80–100)
PLATELETS: 192 10*3/uL (ref 150–440)
RBC: 4.13 10*6/uL (ref 3.80–5.20)
RDW: 14.2 % (ref 11.5–14.5)
WBC: 12.9 10*3/uL — ABNORMAL HIGH (ref 3.6–11.0)

## 2014-08-02 LAB — TROPONIN I

## 2014-08-02 LAB — PRO B NATRIURETIC PEPTIDE: B-TYPE NATIURETIC PEPTID: 2139 pg/mL — AB (ref 0–125)

## 2014-08-03 LAB — TROPONIN I: Troponin-I: <0.02 ng/mL

## 2014-08-04 LAB — URINE CULTURE

## 2014-08-07 LAB — CULTURE, BLOOD (SINGLE)

## 2014-08-21 ENCOUNTER — Ambulatory Visit: Payer: Medicare PPO | Admitting: Cardiovascular Disease

## 2014-08-21 ENCOUNTER — Encounter: Payer: Self-pay | Admitting: *Deleted

## 2014-11-24 ENCOUNTER — Telehealth: Payer: Self-pay | Admitting: *Deleted

## 2014-11-24 ENCOUNTER — Other Ambulatory Visit: Payer: Self-pay | Admitting: *Deleted

## 2014-11-24 MED ORDER — TRAMADOL HCL 50 MG PO TABS: 50.0000 mg | ORAL_TABLET | Freq: Three times a day (TID) | ORAL | Status: DC | PRN

## 2014-11-24 NOTE — Telephone Encounter (Signed)
rx faxed

## 2014-11-24 NOTE — Telephone Encounter (Signed)
Fax from pharmacy requesting Tramadol.  Last refill 10.21.15; last OV 7.20.15.  Please advise refill

## 2014-11-24 NOTE — Telephone Encounter (Signed)
Fine to fill. 

## 2014-11-24 NOTE — Telephone Encounter (Signed)
Called to pharmacy 

## 2014-12-18 ENCOUNTER — Ambulatory Visit (INDEPENDENT_AMBULATORY_CARE_PROVIDER_SITE_OTHER): Payer: Medicare PPO | Admitting: Nurse Practitioner

## 2014-12-18 ENCOUNTER — Encounter (INDEPENDENT_AMBULATORY_CARE_PROVIDER_SITE_OTHER): Payer: Self-pay

## 2014-12-18 ENCOUNTER — Encounter: Payer: Self-pay | Admitting: Nurse Practitioner

## 2014-12-18 VITALS — BP 130/68 | HR 60 | Temp 98.2°F | Resp 12 | Ht 62.0 in | Wt 157.4 lb

## 2014-12-18 DIAGNOSIS — G2581 Restless legs syndrome: Secondary | ICD-10-CM

## 2014-12-18 DIAGNOSIS — H9212 Otorrhea, left ear: Secondary | ICD-10-CM

## 2014-12-18 DIAGNOSIS — M549 Dorsalgia, unspecified: Secondary | ICD-10-CM

## 2014-12-18 MED ORDER — TIOTROPIUM BROMIDE MONOHYDRATE 18 MCG IN CAPS: ORAL_CAPSULE | RESPIRATORY_TRACT | Status: DC

## 2014-12-18 MED ORDER — FUROSEMIDE 40 MG PO TABS: 40.0000 mg | ORAL_TABLET | ORAL | Status: DC | PRN

## 2014-12-18 MED ORDER — TRAMADOL HCL ER 100 MG PO TB24: 100.0000 mg | ORAL_TABLET | Freq: Every day | ORAL | Status: DC

## 2014-12-18 MED ORDER — LOSARTAN POTASSIUM 25 MG PO TABS: 25.0000 mg | ORAL_TABLET | Freq: Every day | ORAL | Status: DC

## 2014-12-18 MED ORDER — ATORVASTATIN CALCIUM 80 MG PO TABS: 80.0000 mg | ORAL_TABLET | Freq: Every day | ORAL | Status: DC

## 2014-12-18 MED ORDER — IPRATROPIUM-ALBUTEROL 20-100 MCG/ACT IN AERS: 1.0000 | INHALATION_SPRAY | RESPIRATORY_TRACT | Status: AC | PRN

## 2014-12-18 MED ORDER — FLUOXETINE HCL 40 MG PO CAPS: 40.0000 mg | ORAL_CAPSULE | Freq: Every day | ORAL | Status: AC

## 2014-12-18 MED ORDER — PRAMIPEXOLE DIHYDROCHLORIDE 0.125 MG PO TABS: 0.1250 mg | ORAL_TABLET | Freq: Every day | ORAL | Status: DC

## 2014-12-18 MED ORDER — RANITIDINE HCL 150 MG PO TABS: 150.0000 mg | ORAL_TABLET | Freq: Two times a day (BID) | ORAL | Status: AC

## 2014-12-18 NOTE — Progress Notes (Signed)
Subjective:    Patient ID: Olivia Fields, female    DOB: 10-27-1951, 64 y.o.   MRN: [id number]  HPI  Olivia Fields is a 64 yo female with a CC of left ear drainage, restless leg syndrome, and medication management.   1) Left ear- best condition recently, draining onto pillow, thick and yellowish, and bad odor can smell when its at is worst.   2) Restless leg syndrome- Been on gabapentin and tramadol for over a year she states, taking 3 of both at night. Motrin- not helpful. EMG testing 3-4 years ago and restless leg syndrome- achy feels she has to move her legs, must stray stretched out.  Iron levels never been off, right leg is the only bothersome leg. Tried Requip in past and it was not helpful.   3) Severe back pain and subluxation of shoulders. She has been worked up before and I asked patient if tramadol was helpful, she stated it was somewhat and if there is a stronger dosage.   Review of Systems  Constitutional: Negative for fever, chills, diaphoresis and fatigue.  HENT: Positive for ear discharge. Negative for ear pain.   Respiratory: Negative for chest tightness, shortness of breath and wheezing.   Cardiovascular: Negative for chest pain, palpitations and leg swelling.  Gastrointestinal: Negative for nausea, vomiting and diarrhea.  Musculoskeletal: Positive for myalgias, back pain and arthralgias.       Right leg restless leg syndrome  Skin: Positive for rash.       Left ear has scaly skin on her ear.   Neurological: Negative for dizziness, weakness, numbness and headaches.  Psychiatric/Behavioral: The patient is not nervous/anxious.    Past Medical History  Diagnosis Date  . MI (myocardial infarction) 2002, 2009, 2015  . CHF (congestive heart failure)   . Acute on chronic renal failure   . Cardiogenic shock   . COPD (chronic obstructive pulmonary disease)   . H/O ovarian cancer 1990  . AAA (abdominal aortic aneurysm)   . Coronary artery disease     Previous  stenting of LAD, LCx and RCA at [facility]. She presented on 03/06/2014 with acute inferior ST elevation myocardial infarction. Cardiac catheterization showed patent stents in the LAD and left circumflex with occluded mid RCA just distal to the previously placed stent. There was also 70% proximal and distal stenosis. She underwent angioplasty and drug-eluting stent placement x3 to the RCA.   Marland Kitchen Hyperlipidemia   . Hypertension     History   Social History  . Marital Status: Married    Spouse Name: N/A    Number of Children: 2  . Years of Education: 16   Occupational History  . Accounting     Retired   Social History Main Topics  . Smoking status: Former Smoker -- 0.25 packs/day for 20 years    Types: Cigarettes    Quit date: 03/11/2010  . Smokeless tobacco: Not on file  . Alcohol Use: Yes     Comment: occasional  . Drug Use: No  . Sexual Activity: Not on file   Other Topics Concern  . Not on file   Social History Narrative   Born and raised in [place], [place]. Works part-time at [employer]. Currrently resides in a house with daughter Anderson Malta, age 108). 62 year old daughter in [place] [facility]). Has 5 granddaughters Vernie Shanks, [place], [place], [place]). Has a cat (Zoe). Active in church, enjoys shopping and dinner with granddaughter. Starting cardiac rehab 04/11/2014. Walks for  exercise.     Past Surgical History  Procedure Laterality Date  . Cardiac catheterization  2004    [facility];x1 stent  . Cardiac catheterization  2006    [facility];x2 stent  . Cardiac catheterization  02/2014    [facility];x3 stent  . Total abdominal hysterectomy w/ bilateral salpingoophorectomy  1990    ovarian cancer    Family History  Problem Relation Age of Onset  . Heart disease Mother   . Heart attack Mother   . Diabetes Mother   . Cancer Mother     Tonsils/Throat  . Heart disease Maternal Aunt   . Heart disease Maternal Aunt   . Heart disease Maternal Aunt   . Heart disease Maternal Aunt   . Heart  disease Maternal Aunt   . Cancer Father     Lung  . Aneurysm Father     No Known Allergies  Current Outpatient Prescriptions on File Prior to Visit  Medication Sig Dispense Refill  . aspirin 81 MG tablet Take 81 mg by mouth daily.    . cetirizine (ZYRTEC) 10 MG tablet Take 10 mg by mouth daily.    . fluticasone (FLONASE) 50 MCG/ACT nasal spray Place 1 spray into both nostrils daily.    Marland Kitchen gabapentin (NEURONTIN) 300 MG capsule Take 1 capsule (300 mg total) by mouth 3 (three) times daily. 90 capsule 6  . metoprolol succinate (TOPROL-XL) 25 MG 24 hr tablet Take 1 tablet (25 mg total) by mouth daily. 30 tablet 6  . nitroGLYCERIN (NITROSTAT) 0.4 MG SL tablet Place 0.4 mg under the tongue every 5 (five) minutes as needed for chest pain.    . ticagrelor (BRILINTA) 90 MG TABS tablet Take 1 tablet (90 mg total) by mouth every 12 (twelve) hours. 60 tablet 6  . Ciprofloxacin HCl 0.2 % otic solution Place 0.2 mLs into the left ear 2 (two) times daily. (Patient not taking: Reported on 12/18/2014) 14 vial 0   No current facility-administered medications on file prior to visit.       Objective:   Physical Exam  Constitutional: She is oriented to person, place, and time. She appears well-developed and well-nourished. No distress.  HENT:  Head: Normocephalic and atraumatic.  Right Ear: External ear normal.  Left external ear is scaly/flakey with inner ear yellow cerumen that is dry and blocking the TM. Did not smell odor. Right TM was clear with a small solid white looking object behind the TM.   Eyes: Conjunctivae and EOM are normal. Pupils are equal, round, and reactive to light. Right eye exhibits no discharge. Left eye exhibits no discharge. No scleral icterus.  Neck: Normal range of motion. Neck supple. No thyromegaly present.  Cardiovascular: Normal rate and regular rhythm.  Exam reveals no gallop and no friction rub.   No murmur heard. Pulmonary/Chest: Effort normal and breath sounds normal.    Musculoskeletal: Normal range of motion. She exhibits tenderness. She exhibits no edema.  Lymphadenopathy:    She has no cervical adenopathy.  Neurological: She is alert and oriented to person, place, and time. She displays normal reflexes. No cranial nerve deficit. She exhibits normal muscle tone. Coordination normal.  Skin: Skin is warm and dry. No rash noted. She is not diaphoretic.  Psychiatric: She has a normal mood and affect. Her behavior is normal. Judgment and thought content normal.      Assessment & Plan:

## 2014-12-18 NOTE — Assessment & Plan Note (Signed)
Pt states she has been diagnosed with RLS for a long time and multiple medications have not been helpful to her. Will try Mirapex 0.125 tapering up while tapering off the Gabapentin over 7 day increments. Asked her to follow up if worsens or not helpful. FU 1 month.

## 2014-12-18 NOTE — Assessment & Plan Note (Signed)
Referral to ENT. Cipro otic drops were not helpful. Want to rule out cholesteatoma.

## 2014-12-18 NOTE — Assessment & Plan Note (Signed)
Olivia Fields was very generalized about her back pain and brought it up at the end of the visit. I advised that we follow up to discuss more about it if the tramadol does not help. Tramadol 100 ER 1 table daily.

## 2014-12-18 NOTE — Progress Notes (Signed)
Pre visit review using our clinic review tool, if applicable. No additional management support is needed unless otherwise documented below in the visit note. 

## 2014-12-18 NOTE — Patient Instructions (Signed)
Take the Mirapex 1 pill for 7 days and 2 pills for each day afterwards. We can up this if not helpful after 7 days of the 2 pills. Take 2-3 hours before bed. Do not stop without consulting [place] first.   Gabapentin will need to be weaned off of with a change in dosage every 7 days.   We will contact you about your referral to ENT.

## 2014-12-19 ENCOUNTER — Telehealth: Payer: Self-pay | Admitting: *Deleted

## 2014-12-19 ENCOUNTER — Other Ambulatory Visit: Payer: Self-pay | Admitting: Nurse Practitioner

## 2014-12-19 MED ORDER — TRAMADOL HCL 50 MG PO TABS: 100.0000 mg | ORAL_TABLET | Freq: Two times a day (BID) | ORAL | Status: DC

## 2014-12-19 NOTE — Telephone Encounter (Signed)
Pt called, stating she had discussed with Morey Hummingbird at her appointment yesterday that she was needing a new Rx, that her insurance would not cover Tramadol ER. I faxed a new Rx for regular Tramadol this morning per Carrie's request. Notified pt. Pt states it was the wrong prescription, that she had told Morey Hummingbird that Tramadol did not work for her so why was another Rx sent? States she needs something other than Tramadol for her back pain and leg pain/RLS. I asked if she had taken anything else in the past that did or did not work for her, she said no. [facility] [place]

## 2014-12-19 NOTE — Telephone Encounter (Signed)
It was my understanding when Olivia Fields talked to her that since the 100 ER of tramadol was not covered she was okay with the 50's making up the dose. If this is not helpful I would send her to the Orthopedist for help with pain management. I discussed with her that I do not do pain management and she verbalized understanding. She was given a new prescription for Mirapex that was for her RLS. If she does not want a referral to an orthopedic or neuro doctor then I will need to see her back for a urine drug screen and to sign a contract if she wants stronger medication.

## 2014-12-19 NOTE — Telephone Encounter (Signed)
PT notified and  verbalized understanding. She states the pharmacy had sent over a PA request for the extended release Tramadol. She would like to try to get that covered first, has not taken it before, before getting a referral to an orthopedist.

## 2014-12-21 IMAGING — CR DG CHEST 1V PORT
1 series · 1 of 1 positions shown · non-contrast
Comparison: 02/20/2014

CLINICAL DATA: Chest pain.  Shortness of breath.  Vomiting

EXAM:
PORTABLE CHEST - 1 VIEW

[ap]
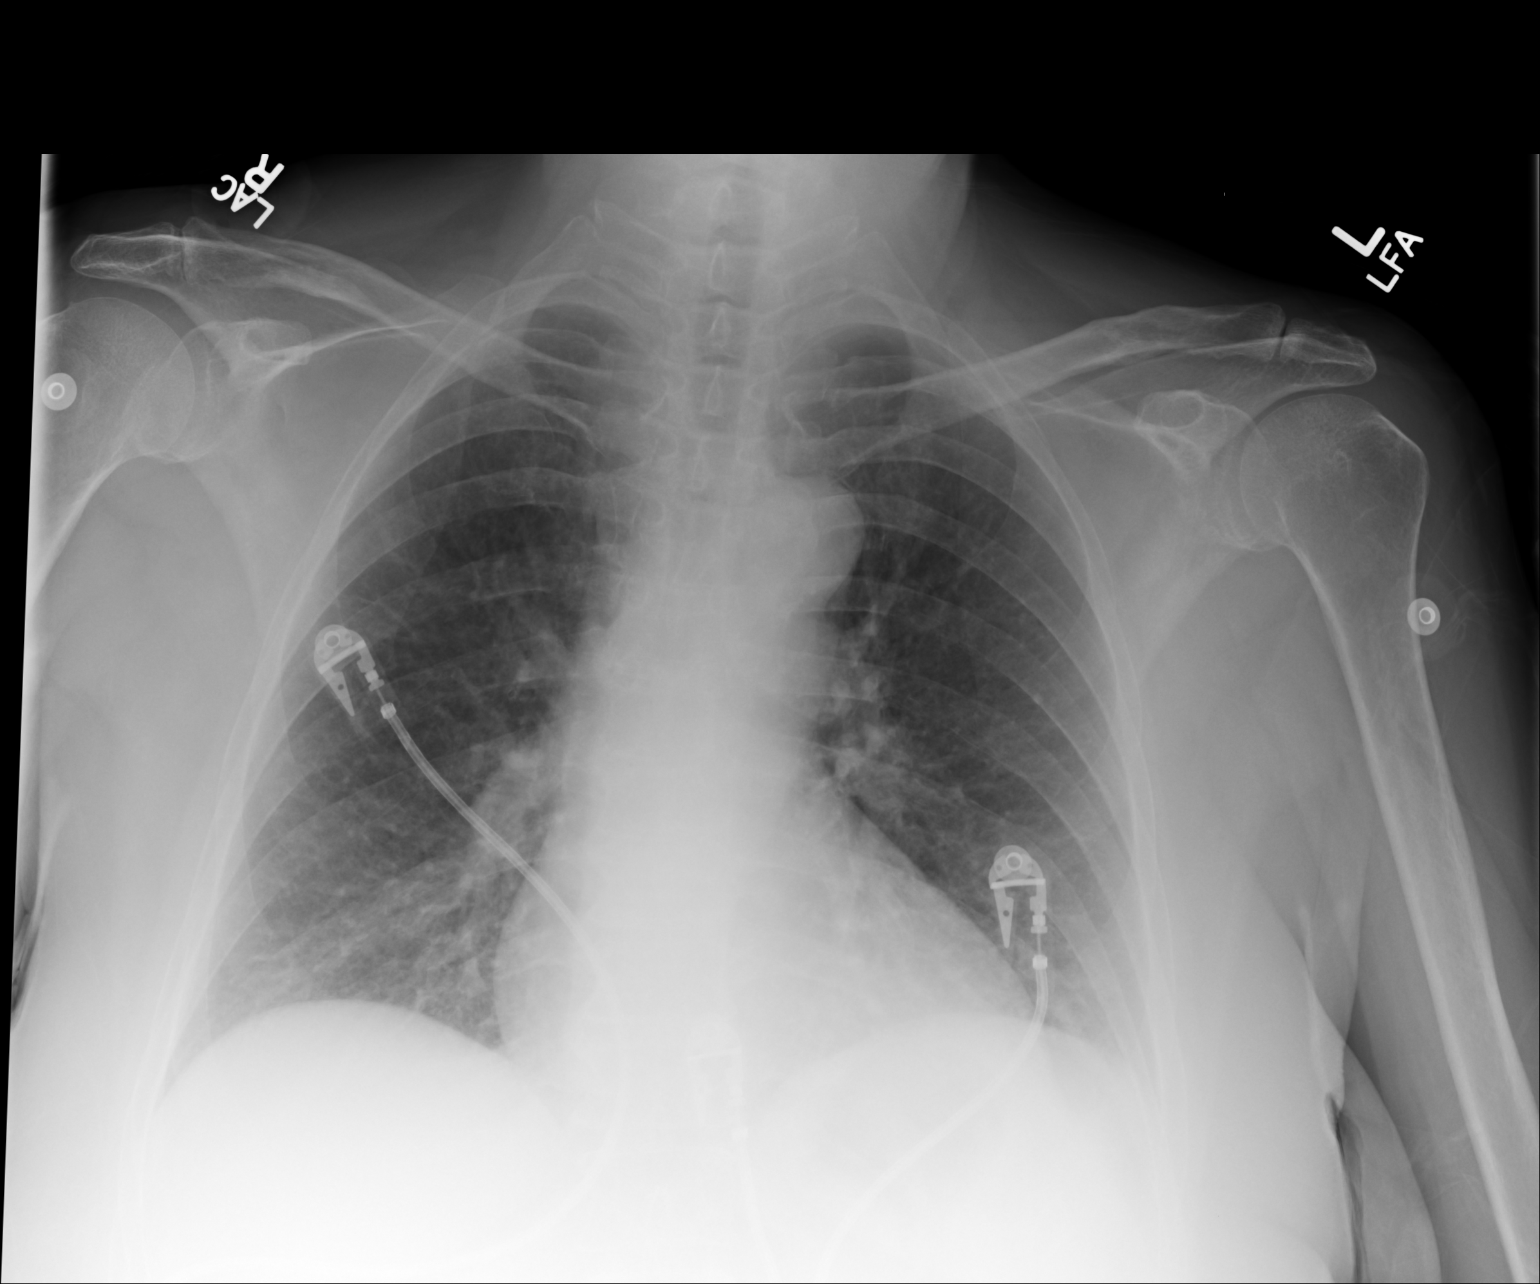

[1 of 1 positions shown; findings below may reference images not displayed]

FINDINGS: Faint chronic interstitial accentuation. Lungs otherwise clear.
Cardiac and mediastinal margins appear normal. No acute findings.
IMPRESSION: Faint chronic interstitial accentuation in the lung bases. No acute
findings.

## 2014-12-22 NOTE — Telephone Encounter (Signed)
PA started online for Tramadol ER 100 mg, pending response.

## 2014-12-23 NOTE — Telephone Encounter (Signed)
PA denied, denial placed in Carrie's box.

## 2015-01-07 LAB — HM MAMMOGRAPHY

## 2015-01-16 ENCOUNTER — Ambulatory Visit: Payer: Medicare PPO | Admitting: Nurse Practitioner

## 2015-01-16 NOTE — Telephone Encounter (Signed)
Patient called to check opn status oF PA notified patient chart stated medication was denied.

## 2015-03-07 NOTE — H&P (Signed)
PATIENT NAME:  Olivia Fields, Olivia Fields MR#:  [id number] DATE OF BIRTH:  [birth year]  DATE OF ADMISSION:  02/19/2014  PRIMARY CARE PHYSICIAN: Nonlocal.   REFERRING PHYSICIAN: Loney Hering, MD  CHIEF COMPLAINT: Shortness of breath, intermittent chills, chest tightness.   HISTORY OF PRESENT ILLNESS: The patient is a 64 year old Caucasian female with past medical history of coronary artery disease, status post 5 stents in the past, COPD, congestive heart failure, history of ovarian cancer, under remission since 1990, and probable chronic renal insufficiency, who is presenting to the ER with a chief complaint of shortness of breath for 2 days. The patient is reporting that she was feeling tight in her chest associated with shortness of breath and intermittent episodes of fever for the past 2 days. Denies any chest pain or sick contacts. She has reported that she has been using her inhalers more often. Denies any chest pain, abdominal pain, nausea, vomiting, diarrhea. No dizziness. No loss of consciousness. Her ambulatory pulse oximetry was at 87%. As the patient came into the ER for worsening of the shortness of breath, chest x-ray has revealed 3 mm nodule over the left mid lung. The patient has received 500 mL fluid bolus, and hospitalist team is called to admit the patient. During my examination, the patient is still feeling tight in her chest. IV Solu-Medrol was given. Blood cultures were obtained, and IV levofloxacin was started for possible developing acute bronchitis. No family members at bedside.   PAST MEDICAL HISTORY:  1. History of ovarian cancer, under remission since 1990, as reported by the patient. 2. Congestive heart failure.  3. COPD. 4. Coronary artery disease, status post 5 stents.  5. Probable chronic renal insufficiency.   PAST SURGICAL HISTORY:  1. Breast lumpectomy.  2. Hysterectomy.  3. Cataract extraction.   ALLERGIES: No known drug allergies.   PSYCHOSOCIAL HISTORY: Lives at  home with daughter. She used to smoke, but quit smoking. Denies alcohol or illicit drug usage.   FAMILY HISTORY: Dad had lung cancer. Coronary artery disease runs in her family.   HOME MEDICATIONS:  1. Flonase 1 spray each nostril once daily.  2. Aspirin 81 mg once daily. 3. Albuterol/ipratropium inhaled 2 times a day. 4. Lipitor 80 mg once daily. 5. Lasix 40 mg once daily. 6. Imdur 30 mg once a day.  7. Gabapentin 100 mg 6 times a day.  8. Zyrtec 10 mg p.o. once daily. 9. Zantac 150 mg 2 times a day. 10. Spiriva inhalation once daily.   REVIEW OF SYSTEMS:  CONSTITUTIONAL: Complaining of intermittent episodes of fever. Complaining of weakness, but denies any fatigue, weight loss or weight gain.  EYES: Denies blurry vision, double vision, eye pain, redness.  ENT: Denies epistaxis, tinnitus, discharge, hearing loss.  RESPIRATION: Intermittent episodes of cough which is dry in nature. No wheezing or hemoptysis. Has shortness of breath and COPD.  CARDIOVASCULAR: No chest pain, palpitations, syncope.  GASTROINTESTINAL: Denies nausea, vomiting, diarrhea, abdominal pain.  GENITOURINARY: No dysuria, hematuria, renal colic. GYNECOLOGIC AND BREASTS: Denies breast mass. Status post right breast lumpectomy. Status post hysterectomy.  ENDOCRINE: Denies polyuria, nocturia, thyroid problems.  HEMATOLOGIC AND LYMPHATIC: No anemia, easy bruising or bleeding. Has chronic history of ovarian cancer.  INTEGUMENTARY: No acne, rash, lesions.  MUSCULOSKELETAL: No joint pain in the neck, back, shoulders or hips.  NEUROLOGIC: Denies vertigo, ataxia.  PSYCHIATRIC: No ADD, OCD.   PHYSICAL EXAMINATION:  VITAL SIGNS: Temperature 97.8, pulse 74, respirations 16 to 18, blood pressure 102/70, pulse oximetry 98%  on 2 liters.  GENERAL APPEARANCE: Not under acute distress. Moderately built and nourished.  HEENT: Normocephalic, atraumatic. Pupils are equally reacting to light and accommodation. No scleral icterus. No  conjunctival injection. No sinus tenderness. Positive nasal congestion. Moist mucous membranes. Positive postnasal drip.  NECK: Supple. No JVD. No thyromegaly. Range of motion is intact.  LUNGS: With some bronchial breath sounds and expiratory wheezing. Moderate air entry. No crackles. No rales or rhonchi.  CARDIAC: S1, S2 normal. Regular rate and rhythm. No murmurs.  GASTROINTESTINAL: Soft. Bowel sounds are positive in all 4 quadrants. Nontender, nondistended. No hepatosplenomegaly. No masses felt.  NEUROLOGIC: Awake, alert and oriented x3. Motor and sensory grossly intact. Cranial nerves II through XII are intact. Reflexes are 2+.  EXTREMITIES: No edema. No cyanosis. No clubbing.  SKIN: Warm to touch. Normal turgor. No rashes. No lesions.  MUSCULOSKELETAL: No joint effusion, tenderness, erythema.  PSYCHIATRIC: Normal mood and affect.   LABORATORY AND IMAGING STUDIES: A 12-lead EKG: Normal sinus rhythm at 84 beats per minute, normal PR and QRS interval. Corrected QTc is at 467. Chest x-ray, PA and lateral views: No active cardiopulmonary disease. A 3 mm nodule over the left mid lung, recommend correlation with the patient's prior exams versus followup chest x-ray in 3 months. Troponin less than 0.02. CBC is normal. UA: Negative nitrite and leukocyte esterase, glucose is negative. Blood glucose is 120, BNP 1273, BUN 49, creatinine 2.31, sodium 139, potassium 3.3, chloride 108, CO2 27, anion gap 4, GFR is 26, serum osmolality and calcium are normal.   ASSESSMENT AND PLAN: A 64 year old Caucasian female presenting to the ER with a chief complaint of 2-day history of shortness of breath, chest tightness and fevers with a dry cough. Will be admitted with the following assessment and plan.   1. Acute hypoxic respiratory failure, probably secondary to acute developing bronchitis with bronchoconstriction, probably viral etiology. Will monitor her on telemetry.  IV steroids.  Nebulizer treatment.  IV  levofloxacin.  Cycle cardiac biomarkers.  Await clinical improvement.  2. Chronic history of chronic obstructive pulmonary disease, not under exacerbation at this time, but looks like she has underlying bronchoconstriction from acute bronchitis.  3. Congestive heart failure. No fluid overload. Resume her home medication.  4. History of ovarian cancer, under remission, as reported by the patient. The patient is recommended to follow up with her oncologist as an outpatient.  5. Will provide her gastrointestinal prophylaxis and deep venous thrombosis prophylaxis with heparin subcutaneous in view of chronic renal insufficiency.  TOTAL TIME SPENT ON ADMISSION: 50 minutes.   ____________________________ Nicholes Mango, MD ag:lb D: 02/19/2014 07:44:13 ET T: 02/19/2014 08:17:34 ET JOB#: [id number]  cc: Nicholes Mango, MD, <Dictator> Primary Care Physician Nicholes Mango MD ELECTRONICALLY SIGNED 03/09/2014 8:14

## 2015-03-07 NOTE — Discharge Summary (Signed)
PATIENT NAME:  Olivia Fields, BLANN MR#:  [id number] DATE OF BIRTH:  [birth year]  DATE OF ADMISSION:  02/19/2014 DATE OF DISCHARGE:  02/20/2014  DISCHARGE DIAGNOSES:  1. Acute respiratory failure secondary to acute bronchitis  copd exacerbation 2. Acute on chronic renal failure. 3. Hypertension. 4. Chronic diastolic heart failure.   DISCHARGE MEDICATIONS:  1. Lipitor 80 mg p.o. daily.  2. Plavix 75 mg p.o. daily. 3. Losartan 25 mg p.o. daily.  4. Metoprolol succinate 50 mg p.o. t.i.d. 5. Imdur 30 mg p.o. daily.  6. Aspirin 81 mg daily.  7. Spiriva 1 capsule inhalation daily.  8. Albuterol inhalation daily. 9. Zantac 150 mg p.o. daily. 10. Zyrtec 10 mg p.o. daily. 11. Neurontin 100 mg p.o. 6 times daily. 12. Prednisone. We gave 20 mg daily for 3 days, then 20 mg 2 tablets daily for 2 days, and then 20 mg daily for 3 days. She will take 2 tablets for 2 days and then 1 tablet for 3 days. 13. The patient was given Levaquin for 5 days.   CONSULTATIONS: None.   HOSPITAL COURSE:  1. The patient is a 64 year old female patient with a history of ovarian cancer in remission since 1990, hypertension, COPD, coronary artery disease with stents, who came in because of trouble breathing, intermittent chills and tightness in the chest. The patient's oxygen saturation was 87% on ambulation and the patient admitted for acute hypoxic respiratory failure due to bronchitis and COPD exacerbation. The patient's EKG showed a normal stress test with no ST-T changes. Chest x-ray showed no acute pulmonary disease but it did show 3-mm nodule at left lung. The patient received Levaquin, nebulizers, steroids, and the patient also continued oxygen. The patient's symptoms improved. However, because of her history of ovarian cancer, I did a VQ scan. The patient's VQ scan was low probability for PE. The patient felt better and she was able to come off oxygen. Oxygen saturations were 94% on room air, and the patient's vitals  showed heart rate of 73, blood pressure 103/61, and temperature 98.4 at the time of discharge. 2. Acute renal failure with possible chronic kidney disease. The patient's creatinine was 2.31, BUN 49, and potassium 3.3 on admission. The patient's CK was normal. The patient's renal ultrasound did not show any acute renal disease. The patient did not have any obstruction or hydronephroses. Her kidney function showed a BUN of 20 (D creatinine 2.14 on April 9. So it improved slightly with fluids. Unable to do CT chest for evaluation of her lung nodule and the patient advised to follow up with doctor at [facility], regarding the lung nodule. Chest x-ray commented that the patient has a 3-mm nodule in the left midlung and the recommended a repeat x-ray in 3 months. The same is discussed with the patient.  PHYSICAL EXAMINATION: On the day of admission: LUNGS: Clear. CARDIOVASCULAR: S1, S2, regular, no murmurs.  GASTROINTESTINAL: Abdomen is soft, nontender, nondistended.   EXTREMITIES: Did not show any edema.  NEUROLOGIC: Alert, awake, oriented. No focal neurological deficits.   TIME SPENT ON DISCHARGE PREPARATION: More than 30 minutes.   The patient was continued on her heart medications with Lipitor, Plavix, losartan, metoprolol, and Imdur and aspirin and patient did not have any chest pain during the hospital stay.  CONDITION AT THE TIME OF DISCHARGE: Stable.   TIME SPENT ON DISCHARGE PREPARATION: More than 30 minutes.    ____________________________ Epifanio Lesches, MD sk:lt D: 02/21/2014 21:37:12 ET T: 02/22/2014 00:56:37 ET JOB#: [id number]  cc: Lise Auer  Vianne Bulls, MD, <Dictator> Epifanio Lesches MD ELECTRONICALLY SIGNED 03/08/2014 7:47

## 2015-03-07 NOTE — Consult Note (Signed)
PATIENT NAME:  Olivia Fields, Olivia Fields MR#:  [id number] DATE OF BIRTH:  [birth year]  DATE OF CONSULTATION:  03/06/2014  REFERRING PHYSICIAN:  Dr. Jimmye Norman in the Emergency Room. CONSULTING PHYSICIAN:  Ysenia Filice A. Fletcher Anon, MD  REASON FOR CONSULTATION: Chest pain.   HISTORY OF PRESENT ILLNESS: This is a 64 year old female with known history of coronary artery disease status post stenting of the LAD, left circumflex and right coronary artery, all done at [facility]; none in the last 2 years. She also has extensive medical problems that include COPD, diastolic heart failure, ovarian cancer under remission since 1990 and chronic kidney disease. The patient was recently hospitalized with bronchitis and acute on chronic renal failure. She presented this morning with acute onset of substernal chest discomfort which started around 8:00 in the morning. The chest pain persisted, in spite of giving aspirin, heparin and nitroglycerin drip, as well as morphine. I saw the patient in the Emergency Room. She was still actively having chest pain. EKG showed 1 mm of inferior ST elevation with significant ST depression in the anterior leads suggestive of posterior involvement. Emergent cardiac catheterization was thus recommended.   PAST MEDICAL HISTORY: 1.  Coronary artery disease, status post three-vessel stenting done at [facility].  2.  History of ovarian cancer under remission.  3.  Chronic diastolic heart failure.  4.  Chronic obstructive pulmonary disease.  5.  Chronic kidney disease.   PAST SURGICAL HISTORY: Breast lumpectomy, hysterectomy and cataract extraction.   ALLERGIES: No known drug allergies.   SOCIAL HISTORY: She is at a smoker, although she denies smoking at the present time. She denies alcohol or recreational drug use.   FAMILY HISTORY: Remarkable for coronary artery disease and cancer.   HOME MEDICATIONS: Include Flonase, aspirin, albuterol, Lipitor 80 mg daily, Lasix 40 mg once daily, Imdur 30 mg once daily,  gabapentin, Zyrtec 10 mg daily, Zantac, Spiriva and Plavix 75 mg once daily.   REVIEW OF SYSTEMS: A 10-point review of systems was performed. It is negative other than what is mentioned in the HPI.   PHYSICAL EXAMINATION: GENERAL: The patient is in significant distress due to chest discomfort.  VITAL SIGNS: Pulse is 90. She is afebrile with a blood pressure of 110/70. She is not hypoxic.  HEENT: Normocephalic, atraumatic.  NECK: No JVD or carotid bruits.  LUNGS: Normal respiratory effort with no use of accessory muscles. Auscultation reveals normal breath sounds.  CARDIOVASCULAR: Normal PMI. Normal S1 and S2 with no gallops or murmurs.  ABDOMEN: Benign, nontender and nondistended.  EXTREMITIES: With no clubbing, cyanosis or edema.  SKIN: Warm and dry with no rash.  PSYCHIATRIC:  She is alert, oriented x 3 with normal mood and affect.   LABORATORY AND DIAGNOSTIC DATA: EKG showed sinus rhythm with 1 mm of inferior ST elevation with ST depression in the anterior leads suggestive of posterior involvement. These EKG changes are new. Labs showed creatinine of 1.8. Initial troponin was mildly elevated.   IMPRESSION: 1.  Acute inferior ST elevation myocardial infarction.  2.  Chronic obstructive pulmonary disease.  3.  Chronic kidney disease.  4.  Chronic diastolic heart failure.   RECOMMENDATIONS: I recommend proceeding with emergent cardiac catheterization. The patient was given aspirin and unfractionated heparin. Risks, benefits and alternatives were discussed with the patient. Obviously, she does have chronic kidney disease and at risk for contrast-induced nephropathy. I will try to minimize the dye load and will avoid doing left ventricular angiography. I will obtain an echocardiogram instead. Further recommendations to follow  after cardiac catheterization.   ____________________________ Mertie Clause Fletcher Anon, MD maa:dmm D: 03/06/2014 11:58:40 ET T: 03/06/2014 12:17:05  ET JOB#: [id number]  cc: Tanay Massiah A. Fletcher Anon, MD, <Dictator> Wellington Hampshire MD ELECTRONICALLY SIGNED 03/26/2014 22:27

## 2015-03-07 NOTE — Consult Note (Signed)
Brief Consult Note: Diagnosis: inferior STEMI.   Patient was seen by consultant.   Consult note dictated.   Comments: recommend emergent cardiac cath. Risks and benefits were explained.  Electronic Signatures: Kathlyn Sacramento (MD)  (Signed 23-Apr-15 11:36)  Authored: Brief Consult Note   Last Updated: 23-Apr-15 11:36 by Kathlyn Sacramento (MD)

## 2015-03-07 NOTE — Discharge Summary (Signed)
PATIENT NAME:  Olivia Fields, Olivia Fields MR#:  [id number] DATE OF BIRTH:  November 15, 1950  DATE OF ADMISSION:  03/06/2014 DATE OF DISCHARGE:  03/09/2014  CONSULTANTS: Dr. Fletcher Anon from cardiology.   PRIMARY CARE PHYSICIAN: At [facility].   CHIEF COMPLAINT: Chest pain.   DISCHARGE DIAGNOSES:  1. Acute ST elevation myocardial infarction with occlusion of right coronary artery requiring 3 drug-eluting stents.  2. Cardiogenic shock resolved.  3. History of coronary artery disease status post myocardial infarction in the past.  4. Hyperlipidemia.  5. Acute on chronic renal failure.  6. Recent chronic obstructive pulmonary disease flare.  7. Chronic diastolic congestive heart failure.  8. Hypertension.  7. History of ovarian cancer under remission.   DISCHARGE MEDICATIONS: Lipitor 80 mg daily, losartan 25 mg daily, Spiriva Respimat once a day, Flonase 1 spray once a day, Zyrtec 10 mg daily, albuterol/ipratropium nebs 4 times a day as needed, aspirin 81 mg daily, gabapentin 100 mg 6 times a day, Zantac 150 mg 2 times a day, metoprolol succinate 25 mg once a day, nitroglycerin 0.4 mg sublingual every 5 minutes p.r.n. for chest pain, Ticagrelor 90 mg every 12 hours.   Stop taking Plavix, Imdur, and prednisone at this time.   DIET: Low sodium, low fat, low cholesterol.   ACTIVITY: As tolerated.   FOLLOWUP: Please follow with PCP and cardiology within 1 to 2 weeks.   DISPOSITION: Home.  SIGNIFICANT LABS AND IMAGING: Initial BNP 818, BUN 32, creatinine 1.83, last creatinine of 1.24 initial sodium 3.1. Initial troponin 0.17, CK-MB of 2.1, peak CK-MB of 292. TSH 0.3. Initial white count of 17.9, hemoglobin 13.8.   Echocardiogram showed EF of 50% to 55%, moderate inferior and inferolateral wall hypokinesis. Impaired relaxation pattern. Cardiac catheter showing 3-vessel CAD with patent stents in the LAD and left circumflex. Acute lesion mid RCA right after a previous placed stent. The patient required 3 stents: First  lesion in the mid RCA, second lesion in proximal RCA, third lesion and distal RCA.   HISTORY OF PRESENT ILLNESS AND HOSPITAL COURSE: For full details of H and P, please see the dictation on April 23 by Dr. Posey Pronto, but briefly, this is a pleasant 64 year old female with history of CAD status post 5 stents in the past, COPD, CHF, which is diastolic in nature with CKD who was here for acute onset of chest pain in early April. She came in with chest pain again and despite giving aspirin and nitroglycerin drip had some chest pain. The patient was seen by Dr. Fletcher Anon and was noted to have inferior ST elevation MI and underwent cardiac catheter requiring 3 drug-eluting stents as described above. She was admitted to the hospitalist service. She did become hypotensive post MRI and did require dopamine.   In regards to MI, she is post stent, has been ambulating in the hall, has been transferred to telemetry and she has no significant chest pain. She will be discharged on aspirin, was instructed to hold her Plavix and will be discharged on the Brilinta, Lipitor low dose beta blocker.   She did have cardiogenic shock here likely due to her inferior MI. She required transient pressors and currently is off of pressors. A beta blocker has been introduced and the patient is tolerating the beta blocker. Although she has been on steroids, we also thought initially that perhaps adrenal insufficiency was playing a role; however, when I discussed it with the patient she is supposed to be off of prednisone as of last week and was on  a prednisone taper for COPD flares. Thus she is not prednisone long term and I suspect adrenal insufficiency is not contributing to her hypotension in rather it was cardiogenic shock and stenting of inferior MI. She does appear to have chronic diastolic CHF and is not in any acute overload state. She did have mild renal failure which was also improved with IV fluids. At this point, she will be discharged  with outpatient followup with her cardiologist, Dr. Fletcher Anon, who will see her. She was also discharged on losartan.   TOTAL TIME SPENT: 40 minutes.   CODE STATUS: The patient is a full code.    ____________________________ Vivien Presto, MD sa:lt D: 03/09/2014 13:53:42 ET T: 03/10/2014 04:10:21 ET JOB#: [id number]  cc: Vivien Presto, MD, <Dictator> Muhammad A. Fletcher Anon, MD Vivien Presto MD ELECTRONICALLY SIGNED 03/13/2014 14:35

## 2015-03-07 NOTE — H&P (Signed)
PATIENT NAME:  Olivia Fields, Olivia Fields MR#:  [id number] DATE OF BIRTH:  04-08-1951  DATE OF ADMISSION:  03/06/2014  PRIMARY CARE PROVIDER: None local.  REFERRING PHYSICIANS:  Dr. Jimmye Norman, Dr. Fletcher Anon.   CHIEF COMPLAINT: Chest pain.   HISTORY OF PRESENT ILLNESS: The patient is a 64 year old white female with a history of coronary artery disease with previous history of 5 stents in the past, COPD, congestive heart failure, history of ovarian cancer, with chronic renal insufficiency. Last hospitalized here on 04/08, who comes in with acute onset of chest pain since this morning. She stated that it was substernal chest discomfort which started around 8:00 a.m. in the morning.  It persisted despite giving aspirin and being on nitroglycerin drip, as well as morphine. The patient was seen by Dr. Fletcher Anon in the Emergency Room, and she was still having chest pain. EKG showed 1 mm inferior ST elevation with significant ST depression in the anterior leads suggestive of posterior MI. Emergent cardiac cath was done. The patient underwent cardiac catheterization and was noted to have an occlusion of the RCA and she underwent 3 stents to the RCA. The rest of the stents that she had were patent. I am admitting the patient after her cardiac catheterization and stenting procedure. The patient currently reports that she is not having any chest pain. She is not having any shortness of breath. She was noted to have a blood pressure that was low and had to be started on dopamine. She otherwise denies any fevers, chills. She was nauseous and threw up  earlier, but no further nausea. Denies any abdominal pain. Denies any urinary symptoms.   PAST MEDICAL HISTORY:  1.  History of coronary artery disease, status post three-vessel stenting done at [facility] in the past. Apparently had 5 total stents previous history to this.  2.  History of ovarian cancer, under remission.  3.  Chronic diastolic CHF.  4.  History of COPD.  5.  Chronic kidney  disease.   PAST SURGICAL HISTORY: Status post breast lumpectomy, hysterectomy and cataract extraction.   ALLERGIES: None.   SOCIAL HISTORY: She used to smoke, but denies any smoking currently. Denies any alcohol or drug use.   FAMILY HISTORY:  Dad had lung cancer. Coronary artery disease in the family.   HOME MEDICATIONS:  Albuterol/ipratropium nebs 4 times a day as needed, aspirin 81 mg 1 tab p.o. daily, Flonase 50 mcg daily, gabapentin 100 mg 1 tab p.o. q.6, Imdur 30 daily, Lipitor 80 at bedtime, losartan 25 daily, metoprolol succinate 50 mg 1 tab p.o. t.i.d., Plavix 75 p.o. daily, prednisone 20 daily, Spiriva inhalation daily, Zantac 150 mg 1 tab p.o. b.i.d., Zyrtec 10 mg 1 tab p.o. daily.   REVIEW OF SYSTEMS:  CONSTITUTIONAL: Denies any fevers. Chest pain earlier. No weight loss. No weight gain.  EYES: No blurred or double vision. No eye pain. No redness.  ENT: Denies any epistaxis, tinnitus, discharge or hearing loss.  RESPIRATORY: Denies any shortness of breath or cough currently. Has a history of COPD. CARDIOVASCULAR:  Had chest pain earlier, but none now. No palpitations. No syncope.  GASTROINTESTINAL: No nausea or vomiting currently. No abdominal pain. No hematemesis. No melena. No IBS. No jaundice.  GENITOURINARY: Denies any dysuria, hematuria, renal calc or frequency.  ENDOCRINE: Denies any polyuria, nocturia or thyroid problems.  HEMATOLOGIC AND LYMPHATIC: Denies any anemia, easy bruisability or bleeding.  SKIN: No acne. No rash. No changes in mole, hair or skin.  MUSCULOSKELETAL: No pain in the neck,  back or shoulder.  NEUROLOGIC: No numbness, CVA, TIA.  PSYCHIATRIC: No anxiety, insomnia or ADD.   PHYSICAL EXAMINATION: VITAL SIGNS: Temperature 97.8, pulse 113, respirations 20, blood pressure 89/64, O2 of 100%.  GENERAL: The patient is a well-developed female, currently appears comfortable, in no acute distress.  HEENT: Head atraumatic, normocephalic. Pupils equally round,  reactive to light and accommodation. There is no conjunctival pallor. No scleral icterus. Nasal exam shows no drainage or ulceration.  Oropharynx is clear without any exudate.  NECK: Supple without any JVD.  CARDIOVASCULAR: Regular rate and rhythm. Tachycardic. No murmurs, rubs, clicks or gallops.  LUNGS: Clear to auscultation bilaterally without any rales, rhonchi or wheezing.  ABDOMEN: Soft, nontender, nondistended. Positive bowel sounds x 4.  EXTREMITIES: No clubbing, cyanosis or edema.  SKIN: No rash.  LYMPHATICS: No lymph nodes palpable.  VASCULAR: Good DP, PT pulses.  PSYCHIATRIC: Not anxious or depressed.   IMAGING AND LABORATORY EVALUATIONS: BNP was 818. Chest x-ray shows faint chronic interstitial attenuation in the lung bases. Glucose 148, BUN 32, creatinine 1.83, sodium 138, potassium 3.1, chloride 105. CO2 is 22, calcium 8.4. LFTs were normal. WBC 17.9, hemoglobin 13.8, platelet count 210. INR 1.0. CPK 58, CK-MB 2.1, troponin 0.17.   ASSESSMENT AND PLAN: The patient is a 64 year old white female with history of coronary artery disease, who presented with chest pain.  1. Chest pain due to ST myocardial infarction status post cardiac catheterization with 3 stents placed; started on Brilinta, which we will continue. In light of her blood pressure being low, her metoprolol will be held.  2.  Hypotension. The patient will be on dopamine to titrate to keep MAP greater than 65.  3.  Chronic obstructive pulmonary disease. We will continue her Combivent as taking at home.  4.  Hyperlipidemia. The patient will be on atorvastatin.  We will check a fasting lipid panel in the a.m.  5.  Miscellaneous: The patient will be on heparin for DVT prophylaxis.   In light of the patient's hypotension requiring pressors, she will need closer monitoring in ICU. She is at high risk of cardiopulmonary arrest and is deemed critical.   TIME SPENT: 60 minutes of critical care time.     ____________________________ Lafonda Mosses Posey Pronto, MD shp:dmm D: 03/06/2014 12:55:00 ET T: 03/06/2014 13:05:39 ET JOB#: [id number]  cc: Emmajean Ratledge H. Posey Pronto, MD, <Dictator> Alric Seton MD ELECTRONICALLY SIGNED 03/06/2014 13:53

## 2015-04-30 ENCOUNTER — Other Ambulatory Visit: Payer: Self-pay | Admitting: Cardiovascular Disease

## 2015-05-04 ENCOUNTER — Telehealth: Payer: Self-pay

## 2015-05-04 NOTE — Telephone Encounter (Signed)
LMTCB, regarding scheduling a mammogram

## 2015-05-07 ENCOUNTER — Other Ambulatory Visit: Payer: Self-pay | Admitting: Internal Medicine

## 2015-05-07 NOTE — Telephone Encounter (Signed)
Last OV 2.4.16.  Please advise refill

## 2015-05-08 NOTE — Telephone Encounter (Signed)
rx faxed

## 2015-05-27 DIAGNOSIS — Z87891 Personal history of nicotine dependence: Secondary | ICD-10-CM | POA: Diagnosis not present

## 2015-05-27 DIAGNOSIS — L02212 Cutaneous abscess of back [any part, except buttock]: Secondary | ICD-10-CM | POA: Diagnosis not present

## 2015-05-27 DIAGNOSIS — Z79899 Other long term (current) drug therapy: Secondary | ICD-10-CM | POA: Insufficient documentation

## 2015-05-27 DIAGNOSIS — I10 Essential (primary) hypertension: Secondary | ICD-10-CM | POA: Insufficient documentation

## 2015-05-27 DIAGNOSIS — L723 Sebaceous cyst: Secondary | ICD-10-CM | POA: Insufficient documentation

## 2015-05-27 DIAGNOSIS — Z7951 Long term (current) use of inhaled steroids: Secondary | ICD-10-CM | POA: Insufficient documentation

## 2015-05-27 NOTE — ED Notes (Signed)
Pt has an abscess middle of back at brastrap area.  Painful and red.  No drainage.  Sx for 2 days

## 2015-05-28 ENCOUNTER — Emergency Department
Admission: EM | Admit: 2015-05-28 | Discharge: 2015-05-28 | Disposition: A | Discharge status: Home / Self Care | Payer: Medicare PPO | Attending: Emergency Medicine | Admitting: Emergency Medicine

## 2015-05-28 DIAGNOSIS — L723 Sebaceous cyst: Secondary | ICD-10-CM

## 2015-05-28 DIAGNOSIS — L0291 Cutaneous abscess, unspecified: Secondary | ICD-10-CM

## 2015-05-28 DIAGNOSIS — L089 Local infection of the skin and subcutaneous tissue, unspecified: Secondary | ICD-10-CM

## 2015-05-28 MED ORDER — SULFAMETHOXAZOLE-TRIMETHOPRIM 800-160 MG PO TABS
ORAL_TABLET | ORAL | Status: AC
  Administered 2015-05-28: 1 via ORAL
  Filled 2015-05-28: qty 1

## 2015-05-28 MED ORDER — LIDOCAINE HCL (PF) 1 % IJ SOLN
INTRAMUSCULAR | Status: AC
  Administered 2015-05-28: 5 mL via INTRADERMAL
  Filled 2015-05-28: qty 5

## 2015-05-28 MED ORDER — SULFAMETHOXAZOLE-TRIMETHOPRIM 800-160 MG PO TABS: 1.0000 | ORAL_TABLET | Freq: Two times a day (BID) | ORAL | Status: AC

## 2015-05-28 MED ORDER — SULFAMETHOXAZOLE-TRIMETHOPRIM 800-160 MG PO TABS
1.0000 | ORAL_TABLET | Freq: Once | ORAL | Status: AC
  Administered 2015-05-28: 1 via ORAL

## 2015-05-28 MED ORDER — BACITRACIN 500 UNIT/GM EX OINT
1.0000 "application " | TOPICAL_OINTMENT | Freq: Once | CUTANEOUS | Status: AC
  Administered 2015-05-28: 1 via TOPICAL

## 2015-05-28 MED ORDER — BACITRACIN ZINC 500 UNIT/GM EX OINT
TOPICAL_OINTMENT | CUTANEOUS | Status: AC
  Administered 2015-05-28: 1 via TOPICAL
  Filled 2015-05-28: qty 0.9

## 2015-05-28 MED ORDER — LIDOCAINE HCL (PF) 1 % IJ SOLN
5.0000 mL | Freq: Once | INTRAMUSCULAR | Status: AC
  Administered 2015-05-28: 5 mL via INTRADERMAL

## 2015-05-28 MED ORDER — KETOROLAC TROMETHAMINE 10 MG PO TABS
10.0000 mg | ORAL_TABLET | Freq: Once | ORAL | Status: AC
  Administered 2015-05-28: 10 mg via ORAL
  Filled 2015-05-28 (×2): qty 1

## 2015-05-28 NOTE — ED Provider Notes (Signed)
[facility] Emergency Department Provider Note  ____________________________________________  Time seen: 1:45 AM  I have reviewed the triage vital signs and the nursing notes.   HISTORY  Chief Complaint Abscess      HPI Olivia Fields is a 64 y.o. female resents with "abscess in the middle of the upper back under her bra strap. Patient admits to a tender red swollen area under her bra strap 2 days. Patient denies any fever. Patient's admits to having a swollen area in that area usually however it is not usually painful and red. He states that his become painful and red in the past 2 days.     Past Medical History  Diagnosis Date  . MI (myocardial infarction) 2002, 2009, 2015  . CHF (congestive heart failure)   . Acute on chronic renal failure   . Cardiogenic shock   . COPD (chronic obstructive pulmonary disease)   . H/O ovarian cancer 1990  . AAA (abdominal aortic aneurysm)   . Coronary artery disease     Previous stenting of LAD, LCx and RCA at [facility]. She presented on 03/06/2014 with acute inferior ST elevation myocardial infarction. Cardiac catheterization showed patent stents in the LAD and left circumflex with occluded mid RCA just distal to the previously placed stent. There was also 70% proximal and distal stenosis. She underwent angioplasty and drug-eluting stent placement x3 to the RCA.   Marland Kitchen Hyperlipidemia   . Hypertension     Patient Active Problem List   Diagnosis Date Noted  . Drainage from left ear 12/18/2014  . Back pain 12/18/2014  . Restless leg syndrome 12/18/2014  . Otalgia 06/02/2014  . Coronary artery disease   . Hyperlipidemia   . Hypertension     Past Surgical History  Procedure Laterality Date  . Cardiac catheterization  2004    [facility];x1 stent  . Cardiac catheterization  2006    [facility];x2 stent  . Cardiac catheterization  02/2014    [facility];x3 stent  . Total abdominal hysterectomy w/ bilateral salpingoophorectomy  1990     ovarian cancer    Current Outpatient Rx  Name  Route  Sig  Dispense  Refill  . aspirin 81 MG tablet   Oral   Take 81 mg by mouth daily.         Marland Kitchen atorvastatin (LIPITOR) 80 MG tablet   Oral   Take 1 tablet (80 mg total) by mouth daily.   90 tablet   3   . BRILINTA 90 MG TABS tablet      TAKE 1 TABLET BY MOUTH EVERY 12 HOURS   60 tablet   0     Pt needs to call office to schedule future appoint ...   . cetirizine (ZYRTEC) 10 MG tablet   Oral   Take 10 mg by mouth daily.         . Ciprofloxacin HCl 0.2 % otic solution   Left Ear   Place 0.2 mLs into the left ear 2 (two) times daily. Patient not taking: Reported on 12/18/2014   14 vial   0   . FLUoxetine (PROZAC) 40 MG capsule   Oral   Take 1 capsule (40 mg total) by mouth daily.   90 capsule   3   . fluticasone (FLONASE) 50 MCG/ACT nasal spray   Each Nare   Place 1 spray into both nostrils daily.         . furosemide (LASIX) 40 MG tablet   Oral  Take 1 tablet (40 mg total) by mouth as needed for fluid or edema.   90 tablet   3   . gabapentin (NEURONTIN) 300 MG capsule   Oral   Take 1 capsule (300 mg total) by mouth 3 (three) times daily.   90 capsule   6   . Ipratropium-Albuterol (COMBIVENT) 20-100 MCG/ACT AERS respimat   Inhalation   Inhale 1 puff into the lungs as needed for wheezing.   1 Inhaler   3   . losartan (COZAAR) 25 MG tablet   Oral   Take 1 tablet (25 mg total) by mouth daily.   90 tablet   3   . metoprolol succinate (TOPROL-XL) 25 MG 24 hr tablet   Oral   Take 1 tablet (25 mg total) by mouth daily.   30 tablet   6   . nitroGLYCERIN (NITROSTAT) 0.4 MG SL tablet   Sublingual   Place 0.4 mg under the tongue every 5 (five) minutes as needed for chest pain.         . pramipexole (MIRAPEX) 0.125 MG tablet   Oral   Take 1 tablet (0.125 mg total) by mouth at bedtime. Take 2-3 hours before bedtime. After seven days can take 2 pills 2-3 hours before bedtime.   90 tablet   0    . ranitidine (ZANTAC) 150 MG tablet   Oral   Take 1 tablet (150 mg total) by mouth 2 (two) times daily.   90 tablet   3   . tiotropium (SPIRIVA HANDIHALER) 18 MCG inhalation capsule      INHALE CONTENTS OF 1 CAPSULE ONCE DAILY USING HANDIHALER   90 capsule   2   . traMADol (ULTRAM) 50 MG tablet      TAKE 1 TABLET BY MOUTH EVERY 8 HOURS AS NEEDED   60 tablet   0     Allergies Review of patient's allergies indicates no known allergies.  Family History  Problem Relation Age of Onset  . Heart disease Mother   . Heart attack Mother   . Diabetes Mother   . Cancer Mother     Tonsils/Throat  . Heart disease Maternal Aunt   . Heart disease Maternal Aunt   . Heart disease Maternal Aunt   . Heart disease Maternal Aunt   . Heart disease Maternal Aunt   . Cancer Father     Lung  . Aneurysm Father     Social History History  Substance Use Topics  . Smoking status: Former Smoker -- 0.25 packs/day for 20 years    Types: Cigarettes    Quit date: 03/11/2010  . Smokeless tobacco: Not on file  . Alcohol Use: Yes     Comment: occasional    Review of Systems  Constitutional: Negative for fever. Eyes: Negative for visual changes. ENT: Negative for sore throat. Cardiovascular: Negative for chest pain. Respiratory: Negative for shortness of breath. Gastrointestinal: Negative for abdominal pain, vomiting and diarrhea. Genitourinary: Negative for dysuria. Musculoskeletal: Negative for back pain. Skin: Positive for abscess Neurological: Negative for headaches, focal weakness or numbness.   10-point ROS otherwise negative.  ____________________________________________   PHYSICAL EXAM:  VITAL SIGNS: ED Triage Vitals  Enc Vitals Group     BP 05/27/15 2216 123/73 mmHg     Pulse Rate 05/27/15 2216 79     Resp 05/27/15 2216 20     Temp 05/27/15 2216 98.6 F (37 C)     Temp Source 05/27/15 2216 Oral  SpO2 05/27/15 2216 99 %     Weight 05/27/15 2216 160 lb (72.576  kg)     Height 05/27/15 2216 5\' 2"  (1.575 m)     Head Cir --      Peak Flow --      Pain Score --      Pain Loc --      Pain Edu? --      Excl. in [place]? --      Constitutional: Alert and oriented. Well appearing and in no distress. Eyes: Conjunctivae are normal. PERRL. Normal extraocular movements. ENT   Head: Normocephalic and atraumatic.   Nose: No congestion/rhinnorhea.   Mouth/Throat: Mucous membranes are moist.   Neck: No stridor. Cardiovascular: Normal rate, regular rhythm. Normal and symmetric distal pulses are present in all extremities. No murmurs, rubs, or gallops. Respiratory: Normal respiratory effort without tachypnea nor retractions. Breath sounds are clear and equal bilaterally. No wheezes/rales/rhonchi. Gastrointestinal: Soft and nontender. No distention. There is no CVA tenderness. Genitourinary: deferred Musculoskeletal: Nontender with normal range of motion in all extremities. No joint effusions.  No lower extremity tenderness nor edema. Neurologic:  Normal speech and language. No gross focal neurologic deficits are appreciated. Speech is normal.  Skin: Tender erythematous swollen area and noted midline of the upper back. Psychiatric: Mood and affect are normal. Speech and behavior are normal. Patient exhibits appropriate insight and judgment.  ____________________________________________   Procedure note: INCISION AND DRAINAGE Performed by: Gregor Hams Consent: Verbal consent obtained. Risks and benefits: risks, benefits and alternatives were discussed Type: abscess  Body area:  mid upper backthesia: local infiltration  Incision was made with a scalpel.  Local anesthetic: lidocaine 1%  Anesthetic total: 5 ml  Complexity: complex Blunt dissection to break up loculations  Drainage: purulent  Drainage amount: Approximately 3 cc of pus with caseous material   Packing material: 1/4 in iodoform gauze  Patient tolerance: Patient  tolerated the procedure well with no immediate complications.     INITIAL IMPRESSION / ASSESSMENT AND PLAN / ED COURSE  Pertinent labs & imaging results that were available during my care of the patient were reviewed by me and considered in my medical decision making (see chart for details).  History and physical exam consistent with infected sebaceous cyst  ____________________________________________   FINAL CLINICAL IMPRESSION(S) / ED DIAGNOSES  Final diagnoses:  Infected sebaceous cyst of skin  Abscess  Infected sebaceous cyst      Gregor Hams, MD 05/28/15 0301

## 2015-05-28 NOTE — Discharge Instructions (Signed)
Epidermal Cyst An epidermal cyst is sometimes called a sebaceous cyst, epidermal inclusion cyst, or infundibular cyst. These cysts usually contain a substance that looks "pasty" or "cheesy" and may have a bad smell. This substance is a protein called keratin. Epidermal cysts are usually found on the face, neck, or trunk. They may also occur in the vaginal area or other parts of the genitalia of both men and women. Epidermal cysts are usually small, painless, slow-growing bumps or lumps that move freely under the skin. It is important not to try to pop them. This may cause an infection and lead to tenderness and swelling. CAUSES  Epidermal cysts may be caused by a deep penetrating injury to the skin or a plugged hair follicle, often associated with acne. SYMPTOMS  Epidermal cysts can become inflamed and cause:  Redness.  Tenderness.  Increased temperature of the skin over the bumps or lumps.  Grayish-white, bad smelling material that drains from the bump or lump. DIAGNOSIS  Epidermal cysts are easily diagnosed by your caregiver during an exam. Rarely, a tissue sample (biopsy) may be taken to rule out other conditions that may resemble epidermal cysts. TREATMENT   Epidermal cysts often get better and disappear on their own. They are rarely ever cancerous.  If a cyst becomes infected, it may become inflamed and tender. This may require opening and draining the cyst. Treatment with antibiotics may be necessary. When the infection is gone, the cyst may be removed with minor surgery.  Small, inflamed cysts can often be treated with antibiotics or by injecting steroid medicines.  Sometimes, epidermal cysts become large and bothersome. If this happens, surgical removal in your caregiver's office may be necessary. HOME CARE INSTRUCTIONS  Only take over-the-counter or prescription medicines as directed by your caregiver.  Take your antibiotics as directed. Finish them even if you start to feel  better. SEEK MEDICAL CARE IF:   Your cyst becomes tender, red, or swollen.  Your condition is not improving or is getting worse.  You have any other questions or concerns. MAKE SURE YOU:  Understand these instructions.  Will watch your condition.  Will get help right away if you are not doing well or get worse. Document Released: 10/01/2004 Document Revised: 01/23/2012 Document Reviewed: 05/09/2011 ExitCare Patient Information 2015 [employer]. This information is not intended to replace advice given to you by your health care provider. Make sure you discuss any questions you have with your health care provider.  

## 2015-05-29 ENCOUNTER — Other Ambulatory Visit: Payer: Self-pay | Admitting: Cardiovascular Disease

## 2015-06-08 ENCOUNTER — Encounter: Payer: Self-pay | Admitting: *Deleted

## 2017-03-02 ENCOUNTER — Emergency Department: Payer: Medicare PPO

## 2017-03-02 ENCOUNTER — Emergency Department
Admission: EM | Admit: 2017-03-02 | Discharge: 2017-03-02 | Disposition: A | Discharge status: Home / Self Care | Payer: Medicare PPO | Attending: Emergency Medicine | Admitting: Emergency Medicine

## 2017-03-02 DIAGNOSIS — Z8543 Personal history of malignant neoplasm of ovary: Secondary | ICD-10-CM | POA: Insufficient documentation

## 2017-03-02 DIAGNOSIS — I110 Hypertensive heart disease with heart failure: Secondary | ICD-10-CM | POA: Insufficient documentation

## 2017-03-02 DIAGNOSIS — I2510 Atherosclerotic heart disease of native coronary artery without angina pectoris: Secondary | ICD-10-CM | POA: Diagnosis not present

## 2017-03-02 DIAGNOSIS — J449 Chronic obstructive pulmonary disease, unspecified: Secondary | ICD-10-CM | POA: Diagnosis not present

## 2017-03-02 DIAGNOSIS — R109 Unspecified abdominal pain: Secondary | ICD-10-CM

## 2017-03-02 DIAGNOSIS — Z79899 Other long term (current) drug therapy: Secondary | ICD-10-CM | POA: Diagnosis not present

## 2017-03-02 DIAGNOSIS — I714 Abdominal aortic aneurysm, without rupture, unspecified: Secondary | ICD-10-CM

## 2017-03-02 DIAGNOSIS — I509 Heart failure, unspecified: Secondary | ICD-10-CM | POA: Insufficient documentation

## 2017-03-02 DIAGNOSIS — Z7982 Long term (current) use of aspirin: Secondary | ICD-10-CM | POA: Insufficient documentation

## 2017-03-02 DIAGNOSIS — Z87891 Personal history of nicotine dependence: Secondary | ICD-10-CM | POA: Insufficient documentation

## 2017-03-02 LAB — BASIC METABOLIC PANEL
Anion gap: 7 (ref 5–15)
BUN: 20 mg/dL (ref 6–20)
CALCIUM: 9.3 mg/dL (ref 8.9–10.3)
CHLORIDE: 104 mmol/L (ref 101–111)
CO2: 27 mmol/L (ref 22–32)
CREATININE: 1.49 mg/dL — AB (ref 0.44–1.00)
GFR calc Af Amer: 42 mL/min — ABNORMAL LOW (ref >60)
GFR calc non Af Amer: 36 mL/min — ABNORMAL LOW (ref >60)
Glucose, Bld: 94 mg/dL (ref 65–99)
Potassium: 4.2 mmol/L (ref 3.5–5.1)
Sodium: 138 mmol/L (ref 135–145)

## 2017-03-02 LAB — APTT: aPTT: 28 seconds (ref 24–36)

## 2017-03-02 LAB — CBC
HCT: 39.4 % (ref 35.0–47.0)
Hemoglobin: 13.4 g/dL (ref 12.0–16.0)
MCH: 29.8 pg (ref 26.0–34.0)
MCHC: 34.1 g/dL (ref 32.0–36.0)
MCV: 87.6 fL (ref 80.0–100.0)
PLATELETS: 190 10*3/uL (ref 150–440)
RBC: 4.5 MIL/uL (ref 3.80–5.20)
RDW: 14.8 % — ABNORMAL HIGH (ref 11.5–14.5)
WBC: 6.9 10*3/uL (ref 3.6–11.0)

## 2017-03-02 LAB — PROTIME-INR
INR: 0.97
Prothrombin Time: 12.9 seconds (ref 11.4–15.2)

## 2017-03-02 LAB — TROPONIN I: Troponin I: <0.03 ng/mL (ref <0.03)

## 2017-03-02 MED ORDER — SODIUM CHLORIDE 0.9 % IV BOLUS (SEPSIS)
1000.0000 mL | Freq: Once | INTRAVENOUS | Status: AC
  Administered 2017-03-02: 1000 mL via INTRAVENOUS

## 2017-03-02 MED ORDER — IOPAMIDOL (ISOVUE-370) INJECTION 76%
75.0000 mL | Freq: Once | INTRAVENOUS | Status: AC | PRN
  Administered 2017-03-02: 75 mL via INTRAVENOUS

## 2017-03-02 MED ORDER — MORPHINE SULFATE (PF) 2 MG/ML IV SOLN
2.0000 mg | Freq: Once | INTRAVENOUS | Status: AC
  Administered 2017-03-02: 2 mg via INTRAVENOUS
  Filled 2017-03-02: qty 1

## 2017-03-02 NOTE — ED Notes (Signed)
Date and time results received: 03/02/17 1414 (use smartphrase ".now" to insert current time)  Test: troponin 0.03 Critical Value: 0.03  Name of Provider Notified: Dr Joya Martyr  Orders Received? Or Actions Taken?: no further orders

## 2017-03-02 NOTE — ED Provider Notes (Signed)
[facility] Emergency Department Provider Note  ____________________________________________  Time seen: Approximately 2:47 PM  I have reviewed the triage vital signs and the nursing notes.   HISTORY  Chief Complaint Back Pain (Hx of AAA)   HPI Olivia Fields is a 66 y.o. female with h/o AAA, CAD, ovarian cancer in remission, CHF, HTN, HLD who presents for evaluation of right flank pain. Patient reports that she woke up yesterday morning with pain in her right flank. Initially the pain was mild however throughout the day yesterday and today's got progressively worse. She is complaining of 10 out of 10 pain that is sharp, located in her right flank, constant and nonradiating. Fields nausea or vomiting, Fields dysuria or hematuria, Fields abdominal pain, Fields chest pain or shortness of breath, Fields fever or chills. She reports that she feels dizzy today. She called her primary care doctor who told her to come to the emergency room to make sure this was not her AAA.  Past Medical History:  Diagnosis Date  . AAA (abdominal aortic aneurysm) ([place])   . Acute on chronic renal failure ([place])   . Cardiogenic shock ([place])   . CHF (congestive heart failure) ([place])   . COPD (chronic obstructive pulmonary disease) ([place])   . Coronary artery disease    Previous stenting of LAD, LCx and RCA at [facility]. She presented on 03/06/2014 with acute inferior ST elevation myocardial infarction. Cardiac catheterization showed patent stents in the LAD and left circumflex with occluded mid RCA just distal to the previously placed stent. There was also 70% proximal and distal stenosis. She underwent angioplasty and drug-eluting stent placement x3 to the RCA.   . H/O ovarian cancer 1990  . Hyperlipidemia   . Hypertension   . MI (myocardial infarction) ([place]) 2002, 2009, 2015    Patient Active Problem List   Diagnosis Date Noted  . Drainage from left ear 12/18/2014  . Back pain 12/18/2014  . Restless leg  syndrome 12/18/2014  . Otalgia 06/02/2014  . Coronary artery disease   . Hyperlipidemia   . Hypertension     Past Surgical History:  Procedure Laterality Date  . CARDIAC CATHETERIZATION  2004   [facility];x1 stent  . CARDIAC CATHETERIZATION  2006   [facility];x2 stent  . CARDIAC CATHETERIZATION  02/2014   [facility];x3 stent  . TOTAL ABDOMINAL HYSTERECTOMY W/ BILATERAL SALPINGOOPHORECTOMY  1990   ovarian cancer    Prior to Admission medications   Medication Sig Start Date End Date Taking? Authorizing Provider  aspirin 81 MG tablet Take 81 mg by mouth daily.    Historical Provider, MD  atorvastatin (LIPITOR) 80 MG tablet Take 1 tablet (80 mg total) by mouth daily. 12/18/14   Rubbie Battiest, NP  BRILINTA 90 MG TABS tablet TAKE 1 TABLET BY MOUTH EVERY 12 HOURS 04/30/15   Wellington Hampshire, MD  cetirizine (ZYRTEC) 10 MG tablet Take 10 mg by mouth daily.    Historical Provider, MD  Ciprofloxacin HCl 0.2 % otic solution Place 0.2 mLs into the left ear 2 (two) times daily. Patient not taking: Reported on 12/18/2014 06/02/14   Raquel Dagoberto Ligas, NP  FLUoxetine (PROZAC) 40 MG capsule Take 1 capsule (40 mg total) by mouth daily. 12/18/14   Rubbie Battiest, NP  fluticasone (FLONASE) 50 MCG/ACT nasal spray Place 1 spray into both nostrils daily.    Historical Provider, MD  furosemide (LASIX) 40 MG tablet Take 1 tablet (40 mg total) by mouth as needed for  fluid or edema. 12/18/14   Rubbie Battiest, NP  gabapentin (NEURONTIN) 300 MG capsule Take 1 capsule (300 mg total) by mouth 3 (three) times daily. 04/10/14   Raquel Dagoberto Ligas, NP  Ipratropium-Albuterol (COMBIVENT) 20-100 MCG/ACT AERS respimat Inhale 1 puff into the lungs as needed for wheezing. 12/18/14   Rubbie Battiest, NP  losartan (COZAAR) 25 MG tablet Take 1 tablet (25 mg total) by mouth daily. 12/18/14   Rubbie Battiest, NP  metoprolol succinate (TOPROL-XL) 25 MG 24 hr tablet Take 1 tablet (25 mg total) by mouth daily. 03/18/14   Wellington Hampshire, MD  nitroGLYCERIN (NITROSTAT) 0.4 MG SL tablet  Place 0.4 mg under the tongue every 5 (five) minutes as needed for chest pain.    Historical Provider, MD  pramipexole (MIRAPEX) 0.125 MG tablet Take 1 tablet (0.125 mg total) by mouth at bedtime. Take 2-3 hours before bedtime. After seven days can take 2 pills 2-3 hours before bedtime. 12/18/14   Rubbie Battiest, NP  ranitidine (ZANTAC) 150 MG tablet Take 1 tablet (150 mg total) by mouth 2 (two) times daily. 12/18/14   Rubbie Battiest, NP  tiotropium (SPIRIVA HANDIHALER) 18 MCG inhalation capsule INHALE CONTENTS OF 1 CAPSULE ONCE DAILY USING HANDIHALER 12/18/14   Rubbie Battiest, NP  traMADol (ULTRAM) 50 MG tablet TAKE 1 TABLET BY MOUTH EVERY 8 HOURS AS NEEDED 05/08/15   Jackolyn Confer, MD    Allergies Patient has Fields known allergies.  Family History  Problem Relation Age of Onset  . Heart disease Mother   . Heart attack Mother   . Diabetes Mother   . Cancer Mother     Tonsils/Throat  . Heart disease Maternal Aunt   . Heart disease Maternal Aunt   . Heart disease Maternal Aunt   . Heart disease Maternal Aunt   . Heart disease Maternal Aunt   . Cancer Father     Lung  . Aneurysm Father     Social History Social History  Substance Use Topics  . Smoking status: Former Smoker    Packs/day: 0.25    Years: 20.00    Types: Cigarettes    Quit date: 03/11/2010  . Smokeless tobacco: Never Used  . Alcohol use Yes     Comment: occasional    Review of Systems  Constitutional: Negative for fever. Eyes: Negative for visual changes. ENT: Negative for sore throat. Neck: Fields neck pain  Cardiovascular: Negative for chest pain. Respiratory: Negative for shortness of breath. Gastrointestinal: Negative for abdominal pain, vomiting or diarrhea. Genitourinary: Negative for dysuria. + R flank pain Musculoskeletal: Negative for back pain. Skin: Negative for rash. Neurological: Negative for headaches, weakness or numbness. Psych: Fields SI or HI  ____________________________________________   PHYSICAL  EXAM:  VITAL SIGNS: ED Triage Vitals [03/02/17 1337]  Enc Vitals Group     BP 126/69     Pulse Rate 60     Resp 18     Temp 98.8 F (37.1 C)     Temp Source Oral     SpO2 98 %     Weight 160 lb (72.6 kg)     Height 5\' 2"  (1.575 m)     Head Circumference      Peak Flow      Pain Score 9     Pain Loc      Pain Edu?      Excl. in [place]?     Constitutional: Alert and oriented. Well appearing and  in Fields apparent distress. HEENT:      Head: Normocephalic and atraumatic.         Eyes: Conjunctivae are normal. Sclera is non-icteric. EOMI. PERRL      Mouth/Throat: Mucous membranes are moist.       Neck: Supple with Fields signs of meningismus. Cardiovascular: Regular rate and rhythm. Fields murmurs, gallops, or rubs. 2+ symmetrical distal pulses are present in all extremities. Fields JVD. Respiratory: Normal respiratory effort. Lungs are clear to auscultation bilaterally. Fields wheezes, crackles, or rhonchi.  Gastrointestinal: Soft, non tender, and non distended with positive bowel sounds. Fields rebound or guarding. Musculoskeletal: Patient has ttp over the muscles on the R mid to lower back. Nontender with normal range of motion in all extremities. Fields edema, cyanosis, or erythema of extremities. Neurologic: Normal speech and language. Face is symmetric. Moving all extremities. Fields gross focal neurologic deficits are appreciated. Skin: Skin is warm, dry and intact. Fields rash noted. Psychiatric: Mood and affect are normal. Speech and behavior are normal.  ____________________________________________   LABS (all labs ordered are listed, but only abnormal results are displayed)  Labs Reviewed  BASIC METABOLIC PANEL - Abnormal; Notable for the following:       Result Value   Creatinine, Ser 1.49 (*)    GFR calc non Af Amer 36 (*)    GFR calc Af Amer 42 (*)    All other components within normal limits  CBC - Abnormal; Notable for the following:    RDW 14.8 (*)    All other components within normal limits    PROTIME-INR  APTT  TROPONIN I  TROPONIN I   ____________________________________________  EKG  ED ECG REPORT I, Rudene Re, the attending physician, personally viewed and interpreted this ECG.  Normal sinus rhythm, rate of 56, normal intervals, normal axis, T-wave inversions in inferior and lateral leads. These inversions are new when compared to prior from  2015 ____________________________________________  RADIOLOGY  CT angio: PND ____________________________________________   PROCEDURES  Procedure(s) performed: None Procedures Critical Care performed:  None ____________________________________________   INITIAL IMPRESSION / ASSESSMENT AND PLAN / ED COURSE   66 y.o. female with h/o AAA, CAD, ovarian cancer in remission, CHF, HTN, HLD who presents for evaluation of right flank pain since yesterday. The patient is neurologically intact, she may well appearing, Fields tenderness palpation over her abdomen, Fields pulsatile mass. She is tender to palpation on the muscles of her back on the right. Patient is concerned this could be her AAA. I have low suspicion at this time based on physical exam and vital signs however will pursue a CT angiogram of her aorta at this time. We'll give 2 mg of morphine for the pain and check basic blood work.    _________________________ 4:25 PM on 03/02/2017 -----------------------------------------  Patient's EKG has T-wave inversions in inferior and lateral leads which is new when compared to prior from 2015. Patient has Fields chest pain or shortness of breath. We'll get 2 sets of cardiac enzymes. Her CT angiogram is pending. Care has been transferred to Dr. Clearnce Hasten.  Pertinent labs & imaging results that were available during my care of the patient were reviewed by me and considered in my medical decision making (see chart for details).    ____________________________________________   FINAL CLINICAL IMPRESSION(S) / ED DIAGNOSES  Final  diagnoses:  Flank pain  Abdominal aortic aneurysm (AAA) without rupture (H[facility])      NEW MEDICATIONS STARTED DURING THIS VISIT:  Discharge Medication List as  of 03/02/2017  6:36 PM       Note:  This document was prepared using Dragon voice recognition software and may include unintentional dictation errors.    Rudene Re, MD 03/06/17 [phone]

## 2017-03-02 NOTE — ED Notes (Signed)
Patient denies pain and is resting comfortably.  

## 2017-03-02 NOTE — ED Triage Notes (Signed)
Pt c/o radiating lower back pain since last night that has progressed and is concerned due to hx of AAA.. VSS at present. Pt is in NAD.Marland Kitchen

## 2017-03-02 NOTE — ED Provider Notes (Signed)
Patient is a 66 year old female with a history of an abdominal aortic aneurysm who is presenting with right flank pain since this morning. She was sent in by her primary care doctor for evaluation after the pain persisted after Flexeril. Plan at this time is to follow-up with the troponin as well as CT angiography.  Physical Exam  BP 103/80   Pulse (!) 53   Temp 98.8 F (37.1 C) (Oral)   Resp 20   Ht 5\' 2"  (1.575 m)   Wt 160 lb (72.6 kg)   SpO2 94%   BMI 29.26 kg/m  ----------------------------------------- 6:34 PM on 03/02/2017 -----------------------------------------   Physical Exam Mild tenderness to the right lower lumbar region. No overlying rash, erythema. ED Course  Procedures  MDM   CT Angio Abd/Pel W and/or Wo Contrast (Final result)  Result time 03/02/17 16:22:42  Final result by Marybelle Killings, MD (03/02/17 16:22:42)           Narrative:   CLINICAL DATA: Low back pain. Abdominal aneurysm  EXAM: CTA ABDOMEN AND PELVIS wITHOUT AND WITH CONTRAST  TECHNIQUE: Multidetector CT imaging of the abdomen and pelvis was performed using the standard protocol during bolus administration of intravenous contrast. Multiplanar reconstructed images and MIPs were obtained and reviewed to evaluate the vascular anatomy.  CONTRAST: 75 cc Isovue 370  COMPARISON: 08/02/2014  FINDINGS: VASCULAR  Aorta: Maximal AP and transverse aortic diameters are 4.4 and 4.4 cm. An infrarenal abdominal aortic aneurysm is present. There is chronic mural thrombus within the aneurysmal segment.  Celiac: Patent. Branch vessels patent.  SMA: Patent  Renals: There are 2 left renal arteries and 2 right renal arteries. There is mild narrowing at the origin of the dominant right renal artery. The tiny diminutive accessory right renal artery is grossly patent. The superior left renal artery is patent. There is moderate narrowing of the second more inferior left renal artery.  IMA: The  IMA origin is occluded. Branch vessels reconstitute.  Inflow: There are diffuse atherosclerotic changes of the right common iliac artery. It is mildly aneurysmal with a maximal diameter of 1.6 cm. Right internal and external iliac arteries are patent. Maximal right internal iliac artery diameter is 1.1 cm. This is mildly ectatic.  Diffuse atherosclerotic calcification of the left common iliac artery without aneurysmal dilatation. Mild atherosclerotic changes of the left internal and external iliac arteries without aneurysmal dilatation. These vessels are patent.  Proximal Outflow: Visualized femoral arterial system is grossly patent.  Veins: Portal, splenic, superior mesenteric, and renal veins are patent.  Review of the MIP images confirms the above findings.  NON-VASCULAR  Lower chest: Dependent atelectasis.  Hepatobiliary: The liver and gallbladder are unremarkable.  Pancreas: Unremarkable  Spleen: Unremarkable  Adrenals/Urinary Tract: There are small cysts in the kidneys. Adrenal glands are within normal limits. Bladder is distended.  Stomach/Bowel: Stomach is within normal limits. There is distention of the horizontal portion of the duodenum. Normal appendix. No obvious mass in the colon. No evidence of complete bowel obstruction.  Lymphatic: No abnormal retroperitoneal adenopathy.  Reproductive: Uterus is absent. Adnexa are within normal limits.  Other: No free-fluid. No evidence of retroperitoneal hemorrhage. No evidence of leakage from the aneurysm.  Musculoskeletal: No vertebral compression deformity. Degenerative disc disease at L3-4.  IMPRESSION: VASCULAR  Infrarenal abdominal aortic aneurysm maximal diameter is 4.4 cm compared with 3.8 cm on the prior study. Recommend followup by ultrasound in 1 year. This recommendation follows ACR consensus guidelines: White Paper of the ACR Incidental Findings Committee  II on Vascular Findings. J Am Coll Radiol  2013; 10:789-794.  Moderate narrowing of the inferior left renal artery.  NON-VASCULAR  No acute intra- abdominal pathology.  No evidence of retroperitoneal hemorrhage to suggest rupture of the aortic aneurysm.   Electronically Signed By: Marybelle Killings M.D. On: 03/02/2017 16:22           Patient with mild increase in the size of her aneurysm. Radiology recommends monitoring and likely reimaging in one year. I discussed these results with the patient and she will follow up with her primary care doctor in [place]. We also discussed Tylenol as well as a salve such as Aspercreme or icy hot to treat what is most likely musculoskeletal pain. The patient is understanding of this plan and willing to comply. Will be discharged. Also with 2 negative troponins. Had inverted T waves on her EKG which appear changed. However, her last EKG was in 2015.       Orbie Pyo, MD 03/02/17 [phone]

## 2017-03-02 NOTE — ED Notes (Signed)
Pt complains of right sided low back pain, pt reports falling 1 week ago, pt denies chest pain

## 2017-08-04 ENCOUNTER — Observation Stay
Admission: EM | Admit: 2017-08-04 | Discharge: 2017-08-08 | Disposition: A | Discharge status: Home / Self Care | Payer: Medicare PPO | Attending: Specialist | Admitting: Specialist

## 2017-08-04 ENCOUNTER — Emergency Department: Payer: Medicare PPO

## 2017-08-04 ENCOUNTER — Observation Stay (HOSPITAL_BASED_OUTPATIENT_CLINIC_OR_DEPARTMENT_OTHER)
Admit: 2017-08-04 | Discharge: 2017-08-04 | Disposition: A | Discharge status: Home / Self Care | Payer: Medicare PPO | Attending: Internal Medicine | Admitting: Internal Medicine

## 2017-08-04 ENCOUNTER — Encounter: Payer: Self-pay | Admitting: *Deleted

## 2017-08-04 ENCOUNTER — Other Ambulatory Visit: Payer: Self-pay

## 2017-08-04 DIAGNOSIS — G2581 Restless legs syndrome: Secondary | ICD-10-CM | POA: Insufficient documentation

## 2017-08-04 DIAGNOSIS — I340 Nonrheumatic mitral (valve) insufficiency: Secondary | ICD-10-CM | POA: Diagnosis not present

## 2017-08-04 DIAGNOSIS — I25110 Atherosclerotic heart disease of native coronary artery with unstable angina pectoris: Secondary | ICD-10-CM | POA: Diagnosis not present

## 2017-08-04 DIAGNOSIS — I5032 Chronic diastolic (congestive) heart failure: Secondary | ICD-10-CM | POA: Insufficient documentation

## 2017-08-04 DIAGNOSIS — Z8543 Personal history of malignant neoplasm of ovary: Secondary | ICD-10-CM | POA: Diagnosis not present

## 2017-08-04 DIAGNOSIS — R079 Chest pain, unspecified: Secondary | ICD-10-CM | POA: Diagnosis present

## 2017-08-04 DIAGNOSIS — N183 Chronic kidney disease, stage 3 (moderate): Secondary | ICD-10-CM | POA: Insufficient documentation

## 2017-08-04 DIAGNOSIS — Z7982 Long term (current) use of aspirin: Secondary | ICD-10-CM | POA: Insufficient documentation

## 2017-08-04 DIAGNOSIS — I200 Unstable angina: Secondary | ICD-10-CM | POA: Diagnosis not present

## 2017-08-04 DIAGNOSIS — F329 Major depressive disorder, single episode, unspecified: Secondary | ICD-10-CM | POA: Diagnosis not present

## 2017-08-04 DIAGNOSIS — E785 Hyperlipidemia, unspecified: Secondary | ICD-10-CM | POA: Diagnosis not present

## 2017-08-04 DIAGNOSIS — I130 Hypertensive heart and chronic kidney disease with heart failure and stage 1 through stage 4 chronic kidney disease, or unspecified chronic kidney disease: Secondary | ICD-10-CM | POA: Insufficient documentation

## 2017-08-04 DIAGNOSIS — Z79899 Other long term (current) drug therapy: Secondary | ICD-10-CM | POA: Diagnosis not present

## 2017-08-04 DIAGNOSIS — I252 Old myocardial infarction: Secondary | ICD-10-CM | POA: Diagnosis not present

## 2017-08-04 DIAGNOSIS — Z955 Presence of coronary angioplasty implant and graft: Secondary | ICD-10-CM | POA: Insufficient documentation

## 2017-08-04 DIAGNOSIS — Z23 Encounter for immunization: Secondary | ICD-10-CM | POA: Insufficient documentation

## 2017-08-04 DIAGNOSIS — G629 Polyneuropathy, unspecified: Secondary | ICD-10-CM | POA: Insufficient documentation

## 2017-08-04 DIAGNOSIS — F17210 Nicotine dependence, cigarettes, uncomplicated: Secondary | ICD-10-CM | POA: Insufficient documentation

## 2017-08-04 DIAGNOSIS — J449 Chronic obstructive pulmonary disease, unspecified: Secondary | ICD-10-CM | POA: Diagnosis not present

## 2017-08-04 HISTORY — DX: Chronic kidney disease, stage 3 (moderate): N18.3

## 2017-08-04 HISTORY — DX: Chronic diastolic (congestive) heart failure: I50.32

## 2017-08-04 HISTORY — DX: Chronic kidney disease, stage 3 unspecified: N18.30

## 2017-08-04 LAB — TROPONIN I
Troponin I: <0.03 ng/mL (ref <0.03)
Troponin I: <0.03 ng/mL (ref <0.03)

## 2017-08-04 LAB — CBC
HEMATOCRIT: 41.4 % (ref 35.0–47.0)
HEMATOCRIT: 39.1 % (ref 35.0–47.0)
HEMOGLOBIN: 13.6 g/dL (ref 12.0–16.0)
Hemoglobin: 14.1 g/dL (ref 12.0–16.0)
MCH: 30.4 pg (ref 26.0–34.0)
MCH: 30.6 pg (ref 26.0–34.0)
MCHC: 34 g/dL (ref 32.0–36.0)
MCHC: 34.8 g/dL (ref 32.0–36.0)
MCV: 89.4 fL (ref 80.0–100.0)
MCV: 87.9 fL (ref 80.0–100.0)
PLATELETS: 160 10*3/uL (ref 150–440)
Platelets: 161 10*3/uL (ref 150–440)
RBC: 4.63 MIL/uL (ref 3.80–5.20)
RBC: 4.45 MIL/uL (ref 3.80–5.20)
RDW: 13.6 % (ref 11.5–14.5)
RDW: 13.4 % (ref 11.5–14.5)
WBC: 5.8 10*3/uL (ref 3.6–11.0)
WBC: 6.3 10*3/uL (ref 3.6–11.0)

## 2017-08-04 LAB — ECHOCARDIOGRAM COMPLETE
HEIGHTINCHES: 61 in
Weight: 2720 oz

## 2017-08-04 LAB — BASIC METABOLIC PANEL
Anion gap: 8 (ref 5–15)
BUN: 29 mg/dL — ABNORMAL HIGH (ref 6–20)
CHLORIDE: 108 mmol/L (ref 101–111)
CO2: 24 mmol/L (ref 22–32)
Calcium: 8.9 mg/dL (ref 8.9–10.3)
Creatinine, Ser: 1.43 mg/dL — ABNORMAL HIGH (ref 0.44–1.00)
GFR calc non Af Amer: 38 mL/min — ABNORMAL LOW (ref >60)
GFR, EST AFRICAN AMERICAN: 44 mL/min — AB (ref >60)
Glucose, Bld: 95 mg/dL (ref 65–99)
POTASSIUM: 4.4 mmol/L (ref 3.5–5.1)
SODIUM: 140 mmol/L (ref 135–145)

## 2017-08-04 LAB — CREATININE, SERUM
CREATININE: 1.4 mg/dL — AB (ref 0.44–1.00)
GFR, EST AFRICAN AMERICAN: 45 mL/min — AB (ref >60)
GFR, EST NON AFRICAN AMERICAN: 39 mL/min — AB (ref >60)

## 2017-08-04 MED ORDER — TRAMADOL HCL 50 MG PO TABS
50.0000 mg | ORAL_TABLET | Freq: Four times a day (QID) | ORAL | Status: DC | PRN
  Administered 2017-08-04: 50 mg via ORAL
  Filled 2017-08-04: qty 1

## 2017-08-04 MED ORDER — ASPIRIN 81 MG PO CHEW
324.0000 mg | CHEWABLE_TABLET | Freq: Once | ORAL | Status: AC
  Administered 2017-08-04: 324 mg via ORAL
  Filled 2017-08-04: qty 4

## 2017-08-04 MED ORDER — ENOXAPARIN SODIUM 40 MG/0.4ML ~~LOC~~ SOLN
40.0000 mg | SUBCUTANEOUS | Status: DC
  Administered 2017-08-04 – 2017-08-06 (×3): 40 mg via SUBCUTANEOUS
  Filled 2017-08-04 (×2): qty 0.4

## 2017-08-04 MED ORDER — FLUOXETINE HCL 20 MG PO CAPS
40.0000 mg | ORAL_CAPSULE | Freq: Every day | ORAL | Status: DC
  Administered 2017-08-04 – 2017-08-08 (×5): 40 mg via ORAL
  Filled 2017-08-04 (×5): qty 2

## 2017-08-04 MED ORDER — FLUTICASONE PROPIONATE 50 MCG/ACT NA SUSP
1.0000 | Freq: Every day | NASAL | Status: DC | PRN
  Filled 2017-08-04: qty 16

## 2017-08-04 MED ORDER — ONDANSETRON HCL 4 MG/2ML IJ SOLN: 4.0000 mg | Freq: Four times a day (QID) | INTRAMUSCULAR | Status: DC | PRN

## 2017-08-04 MED ORDER — IPRATROPIUM-ALBUTEROL 0.5-2.5 (3) MG/3ML IN SOLN: 3.0000 mL | Freq: Four times a day (QID) | RESPIRATORY_TRACT | Status: DC | PRN

## 2017-08-04 MED ORDER — GABAPENTIN 300 MG PO CAPS: 300.0000 mg | ORAL_CAPSULE | Freq: Three times a day (TID) | ORAL | Status: DC

## 2017-08-04 MED ORDER — FAMOTIDINE 20 MG PO TABS
20.0000 mg | ORAL_TABLET | Freq: Every day | ORAL | Status: DC
  Administered 2017-08-04 – 2017-08-08 (×5): 20 mg via ORAL
  Filled 2017-08-04 (×5): qty 1

## 2017-08-04 MED ORDER — LORATADINE 10 MG PO TABS: 10.0000 mg | ORAL_TABLET | Freq: Every day | ORAL | Status: DC | PRN

## 2017-08-04 MED ORDER — ATORVASTATIN CALCIUM 20 MG PO TABS
80.0000 mg | ORAL_TABLET | Freq: Every day | ORAL | Status: DC
  Administered 2017-08-04: 80 mg via ORAL
  Filled 2017-08-04: qty 4

## 2017-08-04 MED ORDER — PREGABALIN 75 MG PO CAPS
150.0000 mg | ORAL_CAPSULE | Freq: Two times a day (BID) | ORAL | Status: DC
  Administered 2017-08-04 – 2017-08-08 (×9): 150 mg via ORAL
  Filled 2017-08-04 (×9): qty 2

## 2017-08-04 MED ORDER — CIPROFLOXACIN HCL 0.2 % OT SOLN: 0.2000 mL | Freq: Two times a day (BID) | OTIC | Status: DC

## 2017-08-04 MED ORDER — LOSARTAN POTASSIUM 25 MG PO TABS
25.0000 mg | ORAL_TABLET | Freq: Every day | ORAL | Status: DC
  Administered 2017-08-04 – 2017-08-08 (×5): 25 mg via ORAL
  Filled 2017-08-04 (×5): qty 1

## 2017-08-04 MED ORDER — TICAGRELOR 90 MG PO TABS
90.0000 mg | ORAL_TABLET | Freq: Two times a day (BID) | ORAL | Status: DC
  Administered 2017-08-04 – 2017-08-08 (×7): 90 mg via ORAL
  Filled 2017-08-04 (×7): qty 1

## 2017-08-04 MED ORDER — TIOTROPIUM BROMIDE MONOHYDRATE 18 MCG IN CAPS
18.0000 ug | ORAL_CAPSULE | Freq: Every day | RESPIRATORY_TRACT | Status: DC
Start: 2017-08-04 — End: 2017-08-08
  Administered 2017-08-04 – 2017-08-08 (×5): 18 ug via RESPIRATORY_TRACT
  Filled 2017-08-04: qty 5

## 2017-08-04 MED ORDER — METOPROLOL SUCCINATE ER 25 MG PO TB24
25.0000 mg | ORAL_TABLET | Freq: Every day | ORAL | Status: DC
  Administered 2017-08-04 – 2017-08-08 (×5): 25 mg via ORAL
  Filled 2017-08-04 (×6): qty 1

## 2017-08-04 MED ORDER — FUROSEMIDE 40 MG PO TABS: 40.0000 mg | ORAL_TABLET | Freq: Every day | ORAL | Status: DC | PRN

## 2017-08-04 MED ORDER — ACETAMINOPHEN 325 MG PO TABS: 650.0000 mg | ORAL_TABLET | ORAL | Status: DC | PRN

## 2017-08-04 MED ORDER — ASPIRIN 81 MG PO CHEW
81.0000 mg | CHEWABLE_TABLET | Freq: Every day | ORAL | Status: DC
  Administered 2017-08-05 – 2017-08-08 (×3): 81 mg via ORAL
  Filled 2017-08-04 (×3): qty 1

## 2017-08-04 MED ORDER — IPRATROPIUM-ALBUTEROL 20-100 MCG/ACT IN AERS: 1.0000 | INHALATION_SPRAY | RESPIRATORY_TRACT | Status: DC | PRN

## 2017-08-04 MED ORDER — PRAMIPEXOLE DIHYDROCHLORIDE 0.25 MG PO TABS
0.1250 mg | ORAL_TABLET | Freq: Every day | ORAL | Status: DC
  Administered 2017-08-04 – 2017-08-07 (×2): 0.125 mg via ORAL
  Filled 2017-08-04 (×3): qty 1

## 2017-08-04 MED ORDER — INFLUENZA VAC SPLIT QUAD 0.5 ML IM SUSY: 0.5000 mL | PREFILLED_SYRINGE | INTRAMUSCULAR | Status: DC

## 2017-08-04 MED ORDER — NITROGLYCERIN 0.4 MG SL SUBL: 0.4000 mg | SUBLINGUAL_TABLET | SUBLINGUAL | Status: DC | PRN

## 2017-08-04 NOTE — ED Notes (Signed)
Attempted IV access x2, unsuccessful.

## 2017-08-04 NOTE — ED Provider Notes (Signed)
[facility] Emergency Department Provider Note  ____________________________________________   I have reviewed the triage vital signs and the nursing notes.   HISTORY  Chief Complaint Chest Pain   History limited by: Not Limited   HPI [place] Fields is a 66 y.o. female who presents to the emergency department today because of concerns for chest pain. The patient states that she started developing the chest pain this morning. She was at the parking lot of her work when it started. She does have pain going down her left arm. It was accompanied by shortness of breath and sweating. She took nitroglycerin which helped ease off the pain. Patient states she has not felt well for the past week or so. She had been planning on trying to make an appointment with her cardiologist. She has a history of heart attacks in the past and states that the pain reminds her of her previous episodes. She last had a catheterization and stent placement 3 years ago. She states she is on prolonged time for blood thinners and has not missed any recent dosage.   Past Medical History:  Diagnosis Date  . AAA (abdominal aortic aneurysm) ([place])   . Acute on chronic renal failure ([place])   . Cardiogenic shock ([place])   . CHF (congestive heart failure) ([place])   . COPD (chronic obstructive pulmonary disease) ([place])   . Coronary artery disease    Previous stenting of LAD, LCx and RCA at [facility]. She presented on 03/06/2014 with acute inferior ST elevation myocardial infarction. Cardiac catheterization showed patent stents in the LAD and left circumflex with occluded mid RCA just distal to the previously placed stent. There was also 70% proximal and distal stenosis. She underwent angioplasty and drug-eluting stent placement x3 to the RCA.   . H/O ovarian cancer 1990  . Hyperlipidemia   . Hypertension   . MI (myocardial infarction) ([place]) 2002, 2009, 2015    Patient Active Problem List   Diagnosis Date  Noted  . Drainage from left ear 12/18/2014  . Back pain 12/18/2014  . Restless leg syndrome 12/18/2014  . Otalgia 06/02/2014  . Coronary artery disease   . Hyperlipidemia   . Hypertension     Past Surgical History:  Procedure Laterality Date  . CARDIAC CATHETERIZATION  2004   [facility];x1 stent  . CARDIAC CATHETERIZATION  2006   [facility];x2 stent  . CARDIAC CATHETERIZATION  02/2014   [facility];x3 stent  . TOTAL ABDOMINAL HYSTERECTOMY W/ BILATERAL SALPINGOOPHORECTOMY  1990   ovarian cancer    Prior to Admission medications   Medication Sig Start Date End Date Taking? Authorizing Provider  aspirin 81 MG tablet Take 81 mg by mouth daily.    [provider]  atorvastatin (LIPITOR) 80 MG tablet Take 1 tablet (80 mg total) by mouth daily. 12/18/14   Rubbie Battiest, RN  BRILINTA 90 MG TABS tablet TAKE 1 TABLET BY MOUTH EVERY 12 HOURS 04/30/15   Wellington Hampshire, MD  cetirizine (ZYRTEC) 10 MG tablet Take 10 mg by mouth daily.    [provider]  Ciprofloxacin HCl 0.2 % otic solution Place 0.2 mLs into the left ear 2 (two) times daily. Patient not taking: Reported on 12/18/2014 06/02/14   Sherryl Barters, NP  FLUoxetine (PROZAC) 40 MG capsule Take 1 capsule (40 mg total) by mouth daily. 12/18/14   Rubbie Battiest, RN  fluticasone (FLONASE) 50 MCG/ACT nasal spray Place 1 spray into both nostrils daily.    [provider]  furosemide (LASIX) 40 MG tablet Take 1 tablet (40 mg total) by mouth as needed for fluid or edema. 12/18/14   Rubbie Battiest, RN  gabapentin (NEURONTIN) 300 MG capsule Take 1 capsule (300 mg total) by mouth 3 (three) times daily. 04/10/14   Rey, Latina Craver, NP  Ipratropium-Albuterol (COMBIVENT) 20-100 MCG/ACT AERS respimat Inhale 1 puff into the lungs as needed for wheezing. 12/18/14   Rubbie Battiest, RN  losartan (COZAAR) 25 MG tablet Take 1 tablet (25 mg total) by mouth daily. 12/18/14   Rubbie Battiest, RN  metoprolol succinate (TOPROL-XL) 25 MG 24 hr tablet Take 1 tablet (25 mg  total) by mouth daily. 03/18/14   Wellington Hampshire, MD  nitroGLYCERIN (NITROSTAT) 0.4 MG SL tablet Place 0.4 mg under the tongue every 5 (five) minutes as needed for chest pain.    [provider]  pramipexole (MIRAPEX) 0.125 MG tablet Take 1 tablet (0.125 mg total) by mouth at bedtime. Take 2-3 hours before bedtime. After seven days can take 2 pills 2-3 hours before bedtime. 12/18/14   Rubbie Battiest, RN  ranitidine (ZANTAC) 150 MG tablet Take 1 tablet (150 mg total) by mouth 2 (two) times daily. 12/18/14   Rubbie Battiest, RN  tiotropium (SPIRIVA HANDIHALER) 18 MCG inhalation capsule INHALE CONTENTS OF 1 CAPSULE ONCE DAILY USING HANDIHALER 12/18/14   Rubbie Battiest, RN  traMADol (ULTRAM) 50 MG tablet TAKE 1 TABLET BY MOUTH EVERY 8 HOURS AS NEEDED 05/08/15   Jackolyn Confer, MD    Allergies Patient has no known allergies.  Family History  Problem Relation Age of Onset  . Heart disease Mother   . Heart attack Mother   . Diabetes Mother   . Cancer Mother        Tonsils/Throat  . Heart disease Maternal Aunt   . Heart disease Maternal Aunt   . Heart disease Maternal Aunt   . Heart disease Maternal Aunt   . Heart disease Maternal Aunt   . Cancer Father        Lung  . Aneurysm Father     Social History Social History  Substance Use Topics  . Smoking status: Former Smoker    Packs/day: 0.25    Years: 20.00    Types: Cigarettes    Quit date: 03/11/2010  . Smokeless tobacco: Never Used  . Alcohol use Yes     Comment: occasional    Review of Systems Constitutional: Positive for sweating.  Eyes: No visual changes. ENT: No sore throat. Cardiovascular: Positive for chest pain. Respiratory: Denies shortness of breath. Gastrointestinal: No abdominal pain.  No nausea, no vomiting.  No diarrhea.   Genitourinary: Negative for dysuria. Musculoskeletal: Positive for left arm pain. Skin: Negative for rash. Neurological: Negative for headaches, focal weakness or  numbness.  ____________________________________________   PHYSICAL EXAM:  VITAL SIGNS: ED Triage Vitals  Enc Vitals Group     BP 08/04/17 0942 115/68     Pulse Rate 08/04/17 0942 65     Resp 08/04/17 0942 18     Temp 08/04/17 0942 98.3 F (36.8 C)     Temp Source 08/04/17 0942 Oral     SpO2 08/04/17 0942 98 %     Weight 08/04/17 0939 160 lb (72.6 kg)     Height 08/04/17 0939 5\' 1"  (1.549 m)     Head Circumference --      Peak Flow --      Pain Score 08/04/17 0939  3   Constitutional: Alert and oriented. Well appearing and in no distress. Eyes: Conjunctivae are normal.  ENT   Head: Normocephalic and atraumatic.   Nose: No congestion/rhinnorhea.   Mouth/Throat: Mucous membranes are moist.   Neck: No stridor. Hematological/Lymphatic/Immunilogical: No cervical lymphadenopathy. Cardiovascular: Normal rate, regular rhythm.  No murmurs, rubs, or gallops.  Respiratory: Normal respiratory effort without tachypnea nor retractions. Breath sounds are clear and equal bilaterally. No wheezes/rales/rhonchi. Gastrointestinal: Soft and non tender. No rebound. No guarding.  Genitourinary: Deferred Musculoskeletal: Normal range of motion in all extremities. No lower extremity edema. Neurologic:  Normal speech and language. No gross focal neurologic deficits are appreciated.  Skin:  Skin is warm, dry and intact. No rash noted. Psychiatric: Mood and affect are normal. Speech and behavior are normal. Patient exhibits appropriate insight and judgment.  ____________________________________________    LABS (pertinent positives/negatives)  Trop <0.03 CBC wnl BMP cr 1.43  ____________________________________________   EKG  I, Nance Pear, attending physician, personally viewed and interpreted this EKG  EKG Time: 0935 Rate: 70 Rhythm: normal sinus rhythm Axis: normal Intervals: qtc 460 QRS: narrow ST changes: q waves III Impression: abnormal  ekg   ____________________________________________    RADIOLOGY  CXR No acute disease  I, Fermin Yan, personally viewed and evaluated these images (plain radiographs) as part of my medical decision making. ____________________________________________   PROCEDURES  Procedures  ____________________________________________   INITIAL IMPRESSION / ASSESSMENT AND PLAN / ED COURSE  Pertinent labs & imaging results that were available during my care of the patient were reviewed by me and considered in my medical decision making (see chart for details).  patient presented to the emergency department today because of episode of chest pain. Patient is high risk given previous history of heart attacks. Did have my examination patient is pain-free. Will give patient aspirin.  Troponin negative. Will plan on admission given high risk.  ____________________________________________   FINAL CLINICAL IMPRESSION(S) / ED DIAGNOSES  Final diagnoses:  Chest pain, unspecified type     Note: This dictation was prepared with Dragon dictation. Any transcriptional errors that result from this process are unintentional     Nance Pear, MD 08/04/17 1038

## 2017-08-04 NOTE — Consult Note (Signed)
Cardiology Consult    Patient ID: GIBSON TELLERIA MRN: [id number], DOB/AGE: 05-09-1951   Admit date: 08/04/2017 Date of Consult: 08/04/2017  Primary Physician: Konrad Saha, MD Primary Cardiologist: Jerilynn Mages. Fletcher Anon, MD - formerly [facility] Requesting Provider: Ricka Burdock, MD  Patient Profile    [place] Fields is a 66 y.o. female with a history of CAD s/p multiple PCI's, HTN, HL, CKD III, tob abuse, COPD, AAA, and diastolic dysfxn, who is being seen today for the evaluation of chest pain at the request of Dr. Margaretmary Eddy.  Past Medical History   Past Medical History:  Diagnosis Date  . AAA (abdominal aortic aneurysm) ([place])    a. 08/2015 Abd U/S: 3.7cm AAA; b. 02/2017 CTA Abd/Pelvis: 4.4 cm infrarenal AAA.  Marland Kitchen Cardiogenic shock ([place])   . Chronic diastolic CHF (congestive heart failure) ([place])    a. 02/2014 Echo: EF 50-55%, mod inf/inflat HK, diast dysfxn, mild Ao sclerosis w/o stenosis.  . CKD (chronic kidney disease), stage III   . COPD (chronic obstructive pulmonary disease) ([place])   . Coronary artery disease    a. Previous stenting of LAD, LCx and RCA at [facility];  b. 02/2014 Inf STEMI/PCI: LM nl, LAD 10p/d patent stent, LCX patent stent, 20/50m, OM2 30, OM3 30, RCA 70p, 162m/d (3.5x12, 2.75x18, 2.5x12 Xience [employer]pine DESs).  . H/O ovarian cancer 1990  . Hyperlipidemia   . Hypertension   . MI (myocardial infarction) ([place]) 2002, 2009, 2015    Past Surgical History:  Procedure Laterality Date  . CARDIAC CATHETERIZATION  2004   [facility];x1 stent  . CARDIAC CATHETERIZATION  2006   [facility];x2 stent  . CARDIAC CATHETERIZATION  02/2014   [facility];x3 stent  . TOTAL ABDOMINAL HYSTERECTOMY W/ BILATERAL SALPINGOOPHORECTOMY  1990   ovarian cancer     Allergies  No Known Allergies  History of Present Illness    66 y/o ? with the above complex PMH including CAD s/p multiple PCI's, HTN, HL, tob abuse, COPD, AAA, and CKD III.  Cardiac hx dates back to 2004, when she underwent stenting of the RCA.  She later required  stenting of the LAD and LCX.  In 02/2014, she was admitted to [facility] with chest pain and inf STEMI.  Cath revealed a total occlusion of the RCA and this required 3 DES.  The LCX and LAD stents were patent.  LV fxn was nl by echo.  She f/u with Dr. Fletcher Anon x 1 in clinic but was then lost to f/u.  She has not seen cardiology since, but is followed by [facility] @ [facility].  She continues to smoke just under a 1/2 ppd and over the years has tried to remain active, though she does not routinely exercise.  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  She says over the past few years, she might  occasionally experience an episode of rest or exertional c/p, maybe once every few months, lasting a few mins, and resolving spontaneously.  She never thought too much of it but then over the past 6+ wks, she has been experiencing progressive DOE, fatigue, and episodes of chest pain have been more frequent and lasting longer.  She also noted that she is more likely to break out in a sweat with routine activity than what she experienced previously.  She notes that if she now has to walk up inclines, she is certain to develop c/p and dyspnea.  Collectively, these  symptoms are similar to what she experienced prior to previous stents.  This AM, she noted mild left arm pain and numbness and then while driving to work she noted left sided chest pain and soreness.  Due to worsening and persistent symptoms, she decided to come into the ED for evaluation.  Here, ECG showed no acute changes, while troponin has been normal.  She continues to note mild, vague, left chest soreness.  Inpatient Medications    . aspirin  81 mg Oral Daily  . atorvastatin  80 mg Oral q1800  . enoxaparin (LOVENOX) injection  40 mg Subcutaneous Q24H  . famotidine  20 mg Oral Daily  . FLUoxetine  40 mg Oral Daily  . [START ON 08/05/2017] Influenza vac split quadrivalent PF  0.5 mL Intramuscular Tomorrow-1000    . losartan  25 mg Oral Daily  . metoprolol succinate  25 mg Oral Daily  . pramipexole  0.125 mg Oral QHS  . pregabalin  150 mg Oral BID  . ticagrelor  90 mg Oral Q12H  . tiotropium  18 mcg Inhalation Daily    Family History    Family History  Problem Relation Age of Onset  . Heart disease Mother   . Heart attack Mother   . Diabetes Mother   . Cancer Mother        Tonsils/Throat  . Heart disease Maternal Aunt   . Heart disease Maternal Aunt   . Heart disease Maternal Aunt   . Heart disease Maternal Aunt   . Heart disease Maternal Aunt   . Cancer Father        Lung  . Aneurysm Father     Social History    Social History   Social History  . Marital status: Single    Spouse name: N/A  . Number of children: 2  . Years of education: 16   Occupational History  . Accounting     retired  . 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   Social History Main Topics  . Smoking status: Current Every Day Smoker    Packs/day: 1.00    Years: 35.00    Types: Cigarettes    Last attempt to quit: 03/11/2010  . Smokeless tobacco: Never Used     Comment: currently smoking 7-8 cigarettes/day  . Alcohol use Yes     Comment: rare  . Drug use: No  . Sexual activity: Not on file   Other Topics Concern  . Not on file   Social History Narrative   Born and raised in [place], [place]. Currrently resides in a house with daughter [place]) in [place]. Another daughter in [place]). Has 5 granddaughters [place] Fields, [place], [place], [place]).      Review of Systems    General:  No chills, fever, night sweats or weight changes.  Cardiovascular:  +++ rest and ex chest pain, +++ dyspnea on exertion, no edema, orthopnea, palpitations, paroxysmal nocturnal dyspnea. Dermatological: No rash, lesions/masses Respiratory: No cough, +++ dyspnea Urologic: No hematuria, dysuria Abdominal:   No nausea, vomiting, diarrhea, bright red blood per rectum, melena, or  hematemesis Neurologic:  No visual changes, wkns, changes in mental status. All other systems reviewed and are otherwise negative except as noted above.  Physical Exam    Blood pressure 129/69, pulse (!) 53, temperature 98.3 F (36.8 C), temperature source Oral, resp. rate 14, height 5\' 1"  (1.549 m), weight 170 lb (77.1 kg), SpO2 99 %.  General: Pleasant, NAD Psych: Normal affect. Neuro: Alert and oriented X 3. Moves all extremities spontaneously. HEENT: Normal  Neck: Supple without bruits or JVD. Lungs:  Resp regular and unlabored, left basilar crackles. Heart: RRR no s3, s4, or murmurs. Abdomen: Soft, non-tender, non-distended, BS + x 4.  Extremities: No clubbing, cyanosis or edema. DP/PT/Radials 2+ and equal bilaterally.  Labs     Recent Labs  08/04/17 0938 08/04/17 1251  TROPONINI <0.03 <0.03   Lab Results  Component Value Date   WBC 6.3 08/04/2017   HGB 13.6 08/04/2017   HCT 39.1 08/04/2017   MCV 87.9 08/04/2017   PLT 161 08/04/2017     Recent Labs Lab 08/04/17 0938 08/04/17 1251  NA 140  --   K 4.4  --   CL 108  --   CO2 24  --   BUN 29*  --   CREATININE 1.43* 1.40*  CALCIUM 8.9  --   GLUCOSE 95  --    Lab Results  Component Value Date   CHOL 125 03/07/2014   HDL 37 (L) 03/07/2014   LDLCALC 63 03/07/2014   TRIG 125 03/07/2014    Radiology Studies    Dg Chest 2 View  Result Date: 08/04/2017 CLINICAL DATA:  Chest pain EXAM: CHEST  2 VIEW COMPARISON:  March 02, 2017 FINDINGS: There is no edema or consolidation. The heart size and pulmonary vascularity are normal. No adenopathy. There is aortic atherosclerosis. There is mild degenerative change in the midthoracic spine. IMPRESSION: Aortic atherosclerosis.  No edema or consolidation. Aortic Atherosclerosis (ICD10-I70.0). Electronically Signed   By: Lowella Grip III M.D.   On: 08/04/2017 10:47    ECG & Cardiac Imaging    RSR, 70, inf/ant infarcts.  Assessment & Plan    1  [place]/CAD:  Pt with a  prior h/o CAD dating back to 2004 with previous multivessel stenting followed by infpost STEMI in 2015 req 3 DES to the RCA.  She says that she did reasonably well following 2015 but noted occas c/p about once every few months.  Over the past 6+ wks, c/p symptoms have been more frequent and are now daily, occurring both @ rest and with exertion.  She also has significant DOE and fatigue.  Symptoms are reminiscent of prior angina.  She presented to the ED today b/c of more persistent c/p with left arm numbness.  ECG is non-acute.  Trop is nl thus far.  She currently describes a left chest soreness, though she is not tender on exam.  Given prior h/o and high pretest probability for recurrent obstructive CAD, we will plan on a diagnostic cath on Monday.  The patient understands that risks include but are not limited to stroke (1 in 1000), death (1 in 53), kidney failure [usually temporary] (1 in 500), bleeding (1 in 200), allergic reaction [possibly serious] (1 in 200), and agrees to proceed.  Cont asa, statin,  blocker, arb, and brilinta (on since 2015).  I will add nitropaste.  If trop returns +, will need to add heparin.  2.  Essential HTN:  Stable on  blocker and ARB.  3.  HL:  Had lipids @ [facility] in January - TC 301, LDL 203.  Cont high potency statin.  ? Compliance @ home as numbers looked much better in 02/2014.  4.  Tob Abuse:  Still smoking 7-8 cigarettes/day.  Complete cessation advised.  5.  CKD III:  Creat 1.31 in Jan. 1.40 this afternoon - relatively stable.  Follow over the weekend.  Hydrate pre-cath.  6.  AAA:  4.4 cm by CTA abd pelvis 02/2017 - up from 3.7 cm in 2016.  Will need f/u in 2019.  BP well controlled.  Cont  blocker.    7.  COPD:  Not actively wheezing.  Smoking cessation advised.  Signed, Murray Hodgkins, NP 08/04/2017, 5:32 PM

## 2017-08-04 NOTE — ED Triage Notes (Signed)
Pt complains of central chest pain starting 45 minutes ago, pt complains of left arm pain, pt took 2 nitroglycerin 20  Minutes ago with minimal relief

## 2017-08-04 NOTE — Progress Notes (Signed)
*  PRELIMINARY RESULTS* Echocardiogram 2D Echocardiogram has been performed.  [place] Fields 08/04/2017, 3:08 PM

## 2017-08-04 NOTE — H&P (Signed)
[facility] [place] NAME: Olivia Fields    MR#:  [id number]  DATE OF BIRTH:  Nov 14, 1951  DATE OF ADMISSION:  08/04/2017  PRIMARY CARE PHYSICIAN: Konrad Saha, MD   REQUESTING/REFERRING PHYSICIAN: Archie Balboa  CHIEF COMPLAINT:   Chest pain HISTORY OF PRESENT ILLNESS:  Olivia Fields  is a 65 y.o. female with a known history of Coronary artery disease status post multiple stents in the past, recently patient had 3 stent placement 3 years ago and sees Dr. Fletcher Anon as an outpatient is presenting to the ED with a chief complaint of chest pain. Patient reports that chest pain started on the left side of the chest this morning. Pain is radiating to the left arm and associated with shortness of breath and diaphoresis. Initial troponin is negative and EKG did not reveal acute ST-T wave changes. Hospitalist team is called to admit the patient.  PAST MEDICAL HISTORY:   Past Medical History:  Diagnosis Date  . AAA (abdominal aortic aneurysm) ([place])   . Acute on chronic renal failure ([place])   . Cardiogenic shock ([place])   . CHF (congestive heart failure) ([place])   . COPD (chronic obstructive pulmonary disease) ([place])   . Coronary artery disease    Previous stenting of LAD, LCx and RCA at [facility]. She presented on 03/06/2014 with acute inferior ST elevation myocardial infarction. Cardiac catheterization showed patent stents in the LAD and left circumflex with occluded mid RCA just distal to the previously placed stent. There was also 70% proximal and distal stenosis. She underwent angioplasty and drug-eluting stent placement x3 to the RCA.   . H/O ovarian cancer 1990  . Hyperlipidemia   . Hypertension   . MI (myocardial infarction) ([place]) 2002, 2009, 2015    PAST SURGICAL HISTOIRY:   Past Surgical History:  Procedure Laterality Date  . CARDIAC CATHETERIZATION  2004   [facility];x1 stent  . CARDIAC CATHETERIZATION  2006   [facility];x2 stent  . CARDIAC  CATHETERIZATION  02/2014   [facility];x3 stent  . TOTAL ABDOMINAL HYSTERECTOMY W/ BILATERAL SALPINGOOPHORECTOMY  1990   ovarian cancer    SOCIAL HISTORY:   Social History  Substance Use Topics  . Smoking status: Former Smoker    Packs/day: 0.25    Years: 20.00    Types: Cigarettes    Quit date: 03/11/2010  . Smokeless tobacco: Never Used  . Alcohol use Yes     Comment: occasional    FAMILY HISTORY:   Family History  Problem Relation Age of Onset  . Heart disease Mother   . Heart attack Mother   . Diabetes Mother   . Cancer Mother        Tonsils/Throat  . Heart disease Maternal Aunt   . Heart disease Maternal Aunt   . Heart disease Maternal Aunt   . Heart disease Maternal Aunt   . Heart disease Maternal Aunt   . Cancer Father        Lung  . Aneurysm Father     DRUG ALLERGIES:  No Known Allergies  REVIEW OF SYSTEMS:  CONSTITUTIONAL: No fever, fatigue or weakness.  EYES: No blurred or double vision.  EARS, NOSE, AND THROAT: No tinnitus or ear pain.  RESPIRATORY: No cough, shortness of breath, wheezing or hemoptysis.  CARDIOVASCULAR: Patient is reporting left-sided chest pain started in the morning associated with shortness of breath and diaphoresis which is resolved during my examination  GASTROINTESTINAL: No nausea, vomiting, diarrhea or abdominal pain.  GENITOURINARY: No dysuria, hematuria.  ENDOCRINE: No polyuria, nocturia,  HEMATOLOGY: No anemia, easy bruising or bleeding SKIN: No rash or lesion. MUSCULOSKELETAL: No joint pain or arthritis.   NEUROLOGIC: No tingling, numbness, weakness.  PSYCHIATRY: No anxiety or depression.   MEDICATIONS AT HOME:   Prior to Admission medications   Medication Sig Start Date End Date Taking? Authorizing Provider  aspirin 81 MG tablet Take 81 mg by mouth daily.   Yes [provider]  BRILINTA 90 MG TABS tablet TAKE 1 TABLET BY MOUTH EVERY 12 HOURS 04/30/15  Yes Wellington Hampshire, MD  FLUoxetine (PROZAC) 40 MG capsule Take  1 capsule (40 mg total) by mouth daily. 12/18/14  Yes Doss, Velora Heckler, RN  furosemide (LASIX) 40 MG tablet Take 1 tablet (40 mg total) by mouth as needed for fluid or edema. 12/18/14  Yes Doss, Velora Heckler, RN  losartan (COZAAR) 25 MG tablet Take 1 tablet (25 mg total) by mouth daily. 12/18/14  Yes Doss, Velora Heckler, RN  metoprolol succinate (TOPROL-XL) 25 MG 24 hr tablet Take 1 tablet (25 mg total) by mouth daily. 03/18/14  Yes Wellington Hampshire, MD  pregabalin (LYRICA) 150 MG capsule Take 150 mg by mouth 2 (two) times daily.   Yes [provider]  ranitidine (ZANTAC) 150 MG tablet Take 1 tablet (150 mg total) by mouth 2 (two) times daily. 12/18/14  Yes Doss, Velora Heckler, RN  atorvastatin (LIPITOR) 80 MG tablet Take 1 tablet (80 mg total) by mouth daily. Patient not taking: Reported on 08/04/2017 12/18/14   Rubbie Battiest, RN  cetirizine (ZYRTEC) 10 MG tablet Take 10 mg by mouth daily.    [provider]  Ciprofloxacin HCl 0.2 % otic solution Place 0.2 mLs into the left ear 2 (two) times daily. Patient not taking: Reported on 12/18/2014 06/02/14   Sherryl Barters, NP  fluticasone (FLONASE) 50 MCG/ACT nasal spray Place 1 spray into both nostrils daily.    [provider]  gabapentin (NEURONTIN) 300 MG capsule Take 1 capsule (300 mg total) by mouth 3 (three) times daily. Patient not taking: Reported on 08/04/2017 04/10/14   Sherryl Barters, NP  Ipratropium-Albuterol (COMBIVENT) 20-100 MCG/ACT AERS respimat Inhale 1 puff into the lungs as needed for wheezing. 12/18/14   Rubbie Battiest, RN  nitroGLYCERIN (NITROSTAT) 0.4 MG SL tablet Place 0.4 mg under the tongue every 5 (five) minutes as needed for chest pain.    [provider]  pramipexole (MIRAPEX) 0.125 MG tablet Take 1 tablet (0.125 mg total) by mouth at bedtime. Take 2-3 hours before bedtime. After seven days can take 2 pills 2-3 hours before bedtime. Patient not taking: Reported on 08/04/2017 12/18/14   Rubbie Battiest, RN  tiotropium (SPIRIVA  HANDIHALER) 18 MCG inhalation capsule INHALE CONTENTS OF 1 CAPSULE ONCE DAILY USING HANDIHALER Patient not taking: Reported on 08/04/2017 12/18/14   Rubbie Battiest, RN  traMADol (ULTRAM) 50 MG tablet TAKE 1 TABLET BY MOUTH EVERY 8 HOURS AS NEEDED 05/08/15   Jackolyn Confer, MD      VITAL SIGNS:  Blood pressure 124/73, pulse 64, temperature 98.3 F (36.8 C), temperature source Oral, resp. rate 18, height 5\' 1"  (1.549 m), weight 72.6 kg (160 lb), SpO2 100 %.  PHYSICAL EXAMINATION:  GENERAL:  66 y.o.-year-old patient lying in the bed with no acute distress.  EYES: Pupils equal, round, reactive to light and accommodation. No scleral icterus. Extraocular muscles intact.  HEENT: Head atraumatic, normocephalic. Oropharynx and  nasopharynx clear.  NECK:  Supple, no jugular venous distention. No thyroid enlargement, no tenderness.  LUNGS: Normal breath sounds bilaterally, no wheezing, rales,rhonchi or crepitation. No use of accessory muscles of respiration.  CARDIOVASCULAR: S1, S2 normal. No murmurs, rubs, or gallops. Left anterior chest wall is tender on palpation ABDOMEN: Soft, nontender, nondistended. Bowel sounds present. No organomegaly or mass.  EXTREMITIES: No pedal edema, cyanosis, or clubbing.  NEUROLOGIC: Cranial nerves II through XII are intact. Muscle strength 5/5 in all extremities. Sensation intact. Gait not checked.  PSYCHIATRIC: The patient is alert and oriented x 3.  SKIN: No obvious rash, lesion, or ulcer.   LABORATORY PANEL:   CBC  Recent Labs Lab 08/04/17 0938  WBC 5.8  HGB 14.1  HCT 41.4  PLT 160   ------------------------------------------------------------------------------------------------------------------  Chemistries   Recent Labs Lab 08/04/17 0938  NA 140  K 4.4  CL 108  CO2 24  GLUCOSE 95  BUN 29*  CREATININE 1.43*  CALCIUM 8.9    ------------------------------------------------------------------------------------------------------------------  Cardiac Enzymes  Recent Labs Lab 08/04/17 0938  TROPONINI <0.03   ------------------------------------------------------------------------------------------------------------------  RADIOLOGY:  Dg Chest 2 View  Result Date: 08/04/2017 CLINICAL DATA:  Chest pain EXAM: CHEST  2 VIEW COMPARISON:  March 02, 2017 FINDINGS: There is no edema or consolidation. The heart size and pulmonary vascularity are normal. No adenopathy. There is aortic atherosclerosis. There is mild degenerative change in the midthoracic spine. IMPRESSION: Aortic atherosclerosis.  No edema or consolidation. Aortic Atherosclerosis (ICD10-I70.0). Electronically Signed   By: Lowella Grip III M.D.   On: 08/04/2017 10:47    EKG:   Orders placed or performed during the hospital encounter of 08/04/17  . ED EKG within 10 minutes  . ED EKG within 10 minutes    IMPRESSION AND PLAN:   Olivia Fields  is a 66 y.o. female with a known history of Coronary artery disease status post multiple stents in the past, recently patient had 3 stent placement 3 years ago and sees Dr. Fletcher Anon as an outpatient is presenting to the ED with a chief complaint of chest pain. Patient reports that chest pain started on the left side of the chest this morning  #Chest pain with history of coronary artery disease and status post multiple stent placement Admit to telemetry Cycle cardiac biomarkers. Initial troponin is negative EKG with nonspecific ST changes Continue home medications aspirin, Brilliant, metoprolol, Cozaar and Lipitor Will get echocardiogram Cardiology consult is placed  #Essential hypertension Continue home medications Toprol-XL and Cozaar and titrate as needed  #Hypernatremia continue Lipitor  #Chronic systolic congestive heart failure no exacerbation at this time Patient takes Lasix as needed Continue  aspirin, Toprol, Cozaar and statin We'll get echocardiogram  #Tobacco abuse disorder  counseled patient to quit smoking for 4 minutes Will provide nicotine patch.  GI prophylaxis with Pepcid DVT prophylaxis with Lovenox subcutaneous   All the records are reviewed and case discussed with ED provider. Management plans discussed with the patient, family and they are in agreement.  CODE STATUS: fc Daughter Anderson Malta is the healthcare power of attorney,  TOTAL TIME TAKING CARE OF THIS PATIENT: 45 minutes.   Note: This dictation was prepared with Dragon dictation along with smaller phrase technology. Any transcriptional errors that result from this process are unintentional.  Nicholes Mango M.D on 08/04/2017 at 11:31 AM  Between 7am to 6pm - Pager - [phone]  After 6pm go to [email or web address] - password [email or web address] [facility]  [place] [facility]  Office  [phone]  CC:  Primary care physician; Konrad Saha, MD

## 2017-08-05 DIAGNOSIS — I200 Unstable angina: Secondary | ICD-10-CM | POA: Diagnosis not present

## 2017-08-05 LAB — HIV ANTIBODY (ROUTINE TESTING W REFLEX): HIV SCREEN 4TH GENERATION: NONREACTIVE

## 2017-08-05 MED ORDER — ROSUVASTATIN CALCIUM 10 MG PO TABS
40.0000 mg | ORAL_TABLET | Freq: Every day | ORAL | Status: DC
  Administered 2017-08-05 – 2017-08-07 (×3): 40 mg via ORAL
  Filled 2017-08-05 (×3): qty 4

## 2017-08-05 MED ORDER — SODIUM CHLORIDE 0.9% FLUSH
3.0000 mL | Freq: Two times a day (BID) | INTRAVENOUS | Status: DC
  Administered 2017-08-05 – 2017-08-07 (×4): 3 mL via INTRAVENOUS

## 2017-08-05 NOTE — Progress Notes (Signed)
[facility] [place] NAME: Olivia Fields    MR#:  [id number]  DATE OF BIRTH:  11-18-1950  SUBJECTIVE:   Patient here due to chest pain, and continues to have intermittent episodes of chest pain at rest. Troponins remained negative. Seen by cardiology and given her inferior/apical wall hypokinesis on echocardiogram plan for cardiac catheterization on Monday.  REVIEW OF SYSTEMS:    Review of Systems  Constitutional: Negative for chills and fever.  HENT: Negative for congestion and tinnitus.   Eyes: Negative for blurred vision and double vision.  Respiratory: Negative for cough, shortness of breath and wheezing.   Cardiovascular: Positive for chest pain. Negative for orthopnea and PND.  Gastrointestinal: Negative for abdominal pain, diarrhea, nausea and vomiting.  Genitourinary: Negative for dysuria and hematuria.  Neurological: Negative for dizziness, sensory change and focal weakness.  All other systems reviewed and are negative.   Nutrition: Heart Healthy Tolerating Diet: Yes Tolerating PT: Ambulatory   DRUG ALLERGIES:  No Known Allergies  VITALS:  Blood pressure 122/80, pulse (!) 56, temperature 98.5 F (36.9 C), temperature source Oral, resp. rate 18, height 5\' 1"  (1.549 m), weight 77.1 kg (170 lb), SpO2 99 %.  PHYSICAL EXAMINATION:   Physical Exam  GENERAL:  66 y.o.-year-old patient lying in bed in no acute distress.  EYES: Pupils equal, round, reactive to light and accommodation. No scleral icterus. Extraocular muscles intact.  HEENT: Head atraumatic, normocephalic. Oropharynx and nasopharynx clear.  NECK:  Supple, no jugular venous distention. No thyroid enlargement, no tenderness.  LUNGS: Normal breath sounds bilaterally, no wheezing, rales, rhonchi. No use of accessory muscles of respiration.  CARDIOVASCULAR: S1, S2 normal. No murmurs, rubs, or gallops.  ABDOMEN: Soft, nontender, nondistended. Bowel sounds present. No  organomegaly or mass.  EXTREMITIES: No cyanosis, clubbing or edema b/l.    NEUROLOGIC: Cranial nerves II through XII are intact. No focal Motor or sensory deficits b/l.   PSYCHIATRIC: The patient is alert and oriented x 3.  SKIN: No obvious rash, lesion, or ulcer.    LABORATORY PANEL:   CBC  Recent Labs Lab 08/04/17 1251  WBC 6.3  HGB 13.6  HCT 39.1  PLT 161   ------------------------------------------------------------------------------------------------------------------  Chemistries   Recent Labs Lab 08/04/17 0938 08/04/17 1251  NA 140  --   K 4.4  --   CL 108  --   CO2 24  --   GLUCOSE 95  --   BUN 29*  --   CREATININE 1.43* 1.40*  CALCIUM 8.9  --    ------------------------------------------------------------------------------------------------------------------  Cardiac Enzymes  Recent Labs Lab 08/04/17 2321  TROPONINI <0.03   ------------------------------------------------------------------------------------------------------------------  RADIOLOGY:  Dg Chest 2 View  Result Date: 08/04/2017 CLINICAL DATA:  Chest pain EXAM: CHEST  2 VIEW COMPARISON:  March 02, 2017 FINDINGS: There is no edema or consolidation. The heart size and pulmonary vascularity are normal. No adenopathy. There is aortic atherosclerosis. There is mild degenerative change in the midthoracic spine. IMPRESSION: Aortic atherosclerosis.  No edema or consolidation. Aortic Atherosclerosis (ICD10-I70.0). Electronically Signed   By: Lowella Grip III M.D.   On: 08/04/2017 10:47     ASSESSMENT AND PLAN:   66 year old female with past medical history of coronary artery disease status post multiple stents in the past, restless leg syndrome, hypertension, hyperlipidemia, depression, neuropathy who presents to the hospital due to chest pain.  1. Chest pain-secondary to unstable angina. Patient has a past medical history of coronary artery disease with stent  placements. Her echocardiogram is  worrisome for inferior apical wall hypokinesis consistent with ischemia. - Her cardiac markers so far are negative. Appreciate cardiology input. - Continue ASA, Brillinta, Metoprolol, Losartan, Crestor.  - plan for Cardiac Cath on Monday.   2. COPD - no acute exacerbation .  - cont. Spiriva, PRN duonebs.   3. HtN - cont. Toprol, Losartan.   4. Hyperlipidemia - cont. Crestor.   5. Neuropathy - cont. Lyrica.   6. Restless leg syndrom - cont. Mirapex.   7. Depression - cont. Prozac.      All the records are reviewed and case discussed with Care Management/Social Worker. Management plans discussed with the patient, family and they are in agreement.  CODE STATUS: Full code  DVT Prophylaxis: Lovenox  TOTAL TIME TAKING CARE OF THIS PATIENT: 30 minutes.   POSSIBLE D/C IN 2-3 DAYS, DEPENDING ON CLINICAL CONDITION.   Henreitta Leber M.D on 08/05/2017 at 3:16 PM  Between 7am to 6pm - Pager - [phone]  After 6pm go to [email or web address] - [employer] [facility]  Office  [phone]  CC: Primary care physician; Konrad Saha, MD

## 2017-08-05 NOTE — Plan of Care (Signed)
Problem: Pain Managment: Goal: General experience of comfort will improve Outcome: Progressing No voiced complaints of pain this shift.   

## 2017-08-05 NOTE — Progress Notes (Signed)
Progress Note  Patient Name: Olivia Fields Date of Encounter: 08/05/2017  Primary Cardiologist: Fletcher Anon  Subjective   She continues to have intermittent episodes of chest pain at rest. Troponin remains negative.  Inpatient Medications    Scheduled Meds: . aspirin  81 mg Oral Daily  . enoxaparin (LOVENOX) injection  40 mg Subcutaneous Q24H  . famotidine  20 mg Oral Daily  . FLUoxetine  40 mg Oral Daily  . Influenza vac split quadrivalent PF  0.5 mL Intramuscular Tomorrow-1000  . losartan  25 mg Oral Daily  . metoprolol succinate  25 mg Oral Daily  . pramipexole  0.125 mg Oral QHS  . pregabalin  150 mg Oral BID  . rosuvastatin  40 mg Oral q1800  . ticagrelor  90 mg Oral Q12H  . tiotropium  18 mcg Inhalation Daily   Continuous Infusions:  PRN Meds: acetaminophen, fluticasone, furosemide, ipratropium-albuterol, loratadine, nitroGLYCERIN, ondansetron (ZOFRAN) IV, traMADol   Vital Signs    Vitals:   08/04/17 2015 08/05/17 0644 08/05/17 0735 08/05/17 1109  BP: 101/68 125/73 (!) 141/83 122/80  Pulse: (!) 58 (!) 56 (!) 56 (!) 56  Resp: 18 18 18 18   Temp: 98.1 F (36.7 C) 98.5 F (36.9 C)  98.5 F (36.9 C)  TempSrc: Oral Oral  Oral  SpO2: 96%  100% 99%  Weight:      Height:        Intake/Output Summary (Last 24 hours) at 08/05/17 1245 Last data filed at 08/05/17 1104  Gross per 24 hour  Intake              600 ml  Output             2120 ml  Net            -1520 ml   Filed Weights   08/04/17 0939 08/04/17 1131  Weight: 160 lb (72.6 kg) 170 lb (77.1 kg)    Telemetry    Sinus bradycardia - Personally Reviewed  ECG    Sinus rhythm with poor R-wave progression in the anterior leads - Personally Reviewed  Physical Exam   GEN: No acute distress.   Neck: No JVD Cardiac: RRR, no rubs, or gallops. One out of 6 systolic ejection murmur in the aortic area Respiratory: Clear to auscultation bilaterally. GI: Soft, nontender, non-distended  MS: No edema; No  deformity. Neuro:  Nonfocal  Psych: Normal affect   Labs    Chemistry Recent Labs Lab 08/04/17 0938 08/04/17 1251  NA 140  --   K 4.4  --   CL 108  --   CO2 24  --   GLUCOSE 95  --   BUN 29*  --   CREATININE 1.43* 1.40*  CALCIUM 8.9  --   GFRNONAA 38* 39*  GFRAA 44* 45*  ANIONGAP 8  --      Hematology Recent Labs Lab 08/04/17 0938 08/04/17 1251  WBC 5.8 6.3  RBC 4.63 4.45  HGB 14.1 13.6  HCT 41.4 39.1  MCV 89.4 87.9  MCH 30.4 30.6  MCHC 34.0 34.8  RDW 13.6 13.4  PLT 160 161    Cardiac Enzymes Recent Labs Lab 08/04/17 0938 08/04/17 1251 08/04/17 1733 08/04/17 2321  TROPONINI <0.03 <0.03 <0.03 <0.03   No results for input(s): TROPIPOC in the last 168 hours.   BNPNo results for input(s): BNP, PROBNP in the last 168 hours.   DDimer No results for input(s): DDIMER in the last 168 hours.  Radiology    Dg Chest 2 View  Result Date: 08/04/2017 CLINICAL DATA:  Chest pain EXAM: CHEST  2 VIEW COMPARISON:  March 02, 2017 FINDINGS: There is no edema or consolidation. The heart size and pulmonary vascularity are normal. No adenopathy. There is aortic atherosclerosis. There is mild degenerative change in the midthoracic spine. IMPRESSION: Aortic atherosclerosis.  No edema or consolidation. Aortic Atherosclerosis (ICD10-I70.0). Electronically Signed   By: Lowella Grip III M.D.   On: 08/04/2017 10:47    Cardiac Studies   Echocardiogram on September 21: - Left ventricle: The cavity size was normal. Wall thickness was   normal. Systolic function was mildly to moderately reduced. The   estimated ejection fraction was in the range of 40% to 45%.   Severe hypokinesis of the mid-apicalanteroseptal, anterior, and   apical myocardium. Doppler parameters are consistent with   abnormal left ventricular relaxation (grade 1 diastolic   dysfunction). - Aortic valve: Mean gradient (S): 5 mm Hg. Valve area (VTI): 1.66   cm^2. Valve area (Vmean): 1.54 cm^2. - Mitral  valve: There was mild regurgitation.  Patient Profile     66 y.o. female with known history of coronary artery disease status post three-vessel PCI's with multiple stents, hypertension, hyperlipidemia, stage III chronic kidney disease and abdominal aortic aneurysm who presented with chest pain at rest suggestive of unstable angina.  Assessment & Plan    1. Unstable angina: Extensive history of cardiac stenting and previous inferior ST elevation myocardial infarction in 2015. She is having intermittent episodes of chest pain at rest. Echocardiogram showed mildly reduced EF with severe hypokinesis of the mid/apical anteroseptal, anterior and apical myocardium. Given her symptoms and wall motion abnormality on echo, I have recommended proceeding with cardiac catheterization and possible PCI. This is scheduled for Monday morning. I discussed the procedure in details as well as risk and benefits. Planned access via the right radial artery.  2. Severe hyperlipidemia: In spite of treatment, most recent LDL was above 200. I'm going to switch atorvastatin to rosuvastatin 40 mg once daily. I suspect that the patient will most likely require treatment with a PCSK9 inhibitor which can be initiated in the outpatient setting.  3. Tobacco use: Smoking cessation is advised.  For questions or updates, please contact [place] Please consult [email or web address] for contact info under Cardiology/STEMI.      Signed, Kathlyn Sacramento, MD  08/05/2017, 12:45 PM

## 2017-08-06 DIAGNOSIS — I25110 Atherosclerotic heart disease of native coronary artery with unstable angina pectoris: Secondary | ICD-10-CM | POA: Diagnosis not present

## 2017-08-06 MED ORDER — SODIUM CHLORIDE 0.9% FLUSH: 3.0000 mL | INTRAVENOUS | Status: DC | PRN

## 2017-08-06 MED ORDER — ASPIRIN 81 MG PO CHEW: 81.0000 mg | CHEWABLE_TABLET | ORAL | Status: AC

## 2017-08-06 MED ORDER — SODIUM CHLORIDE 0.9 % IV SOLN: 250.0000 mL | INTRAVENOUS | Status: DC | PRN

## 2017-08-06 MED ORDER — SODIUM CHLORIDE 0.9 % WEIGHT BASED INFUSION
1.0000 mL/kg/h | INTRAVENOUS | Status: DC
  Administered 2017-08-07: 1 mL/kg/h via INTRAVENOUS

## 2017-08-06 MED ORDER — ASPIRIN 81 MG PO CHEW
81.0000 mg | CHEWABLE_TABLET | ORAL | Status: AC
  Administered 2017-08-07: 81 mg via ORAL
  Filled 2017-08-06: qty 1

## 2017-08-06 MED ORDER — SODIUM CHLORIDE 0.9% FLUSH: 3.0000 mL | Freq: Two times a day (BID) | INTRAVENOUS | Status: DC

## 2017-08-06 MED ORDER — SODIUM CHLORIDE 0.9 % WEIGHT BASED INFUSION: 1.0000 mL/kg/h | INTRAVENOUS | Status: DC

## 2017-08-06 MED ORDER — SODIUM CHLORIDE 0.9% FLUSH
3.0000 mL | Freq: Two times a day (BID) | INTRAVENOUS | Status: DC
  Administered 2017-08-06: 3 mL via INTRAVENOUS

## 2017-08-06 NOTE — Progress Notes (Signed)
Progress Note  Patient Name: Olivia Fields Date of Encounter: 08/06/2017  Primary Cardiologist: Fletcher Anon  Subjective   She reports feeling better overall although she continues to have intermittent substernal chest pain at rest.  Inpatient Medications    Scheduled Meds: . aspirin  81 mg Oral Daily  . [START ON 08/07/2017] aspirin  81 mg Oral Pre-Cath  . enoxaparin (LOVENOX) injection  40 mg Subcutaneous Q24H  . famotidine  20 mg Oral Daily  . FLUoxetine  40 mg Oral Daily  . Influenza vac split quadrivalent PF  0.5 mL Intramuscular Tomorrow-1000  . losartan  25 mg Oral Daily  . metoprolol succinate  25 mg Oral Daily  . pramipexole  0.125 mg Oral QHS  . pregabalin  150 mg Oral BID  . rosuvastatin  40 mg Oral q1800  . sodium chloride flush  3 mL Intravenous Q12H  . sodium chloride flush  3 mL Intravenous Q12H  . ticagrelor  90 mg Oral Q12H  . tiotropium  18 mcg Inhalation Daily   Continuous Infusions: . sodium chloride    . sodium chloride     PRN Meds: sodium chloride, acetaminophen, fluticasone, furosemide, ipratropium-albuterol, loratadine, nitroGLYCERIN, ondansetron (ZOFRAN) IV, sodium chloride flush, traMADol   Vital Signs    Vitals:   08/05/17 2048 08/06/17 0504 08/06/17 0907 08/06/17 0922  BP: 107/66 109/63 (!) 110/50   Pulse: (!) 57 63 (!) 58 (!) 59  Resp:  18 18   Temp: 97.6 F (36.4 C) 98.5 F (36.9 C) (!) 97.5 F (36.4 C)   TempSrc: Oral Oral Oral   SpO2: 96% 95% 98%   Weight:      Height:        Intake/Output Summary (Last 24 hours) at 08/06/17 1116 Last data filed at 08/06/17 1025  Gross per 24 hour  Intake              483 ml  Output             2420 ml  Net            -1937 ml   Filed Weights   08/04/17 0939 08/04/17 1131  Weight: 160 lb (72.6 kg) 170 lb (77.1 kg)    Telemetry    Sinus bradycardia - Personally Reviewed  ECG    Sinus rhythm with poor R-wave progression in the anterior leads - Personally Reviewed  Physical Exam     GEN: No acute distress.   Neck: No JVD Cardiac: RRR, no rubs, or gallops. 1/6 systolic ejection murmur in the aortic area Respiratory: Clear to auscultation bilaterally. GI: Soft, nontender, non-distended  MS: No edema; No deformity. Neuro:  Nonfocal  Psych: Normal affect   Labs    Chemistry  Recent Labs Lab 08/04/17 0938 08/04/17 1251  NA 140  --   K 4.4  --   CL 108  --   CO2 24  --   GLUCOSE 95  --   BUN 29*  --   CREATININE 1.43* 1.40*  CALCIUM 8.9  --   GFRNONAA 38* 39*  GFRAA 44* 45*  ANIONGAP 8  --      Hematology  Recent Labs Lab 08/04/17 0938 08/04/17 1251  WBC 5.8 6.3  RBC 4.63 4.45  HGB 14.1 13.6  HCT 41.4 39.1  MCV 89.4 87.9  MCH 30.4 30.6  MCHC 34.0 34.8  RDW 13.6 13.4  PLT 160 161    Cardiac Enzymes  Recent Labs Lab 08/04/17 0938 08/04/17  1251 08/04/17 1733 08/04/17 2321  TROPONINI <0.03 <0.03 <0.03 <0.03   No results for input(s): TROPIPOC in the last 168 hours.   BNPNo results for input(s): BNP, PROBNP in the last 168 hours.   DDimer No results for input(s): DDIMER in the last 168 hours.   Radiology    No results found.  Cardiac Studies   Echocardiogram on September 21: - Left ventricle: The cavity size was normal. Wall thickness was   normal. Systolic function was mildly to moderately reduced. The   estimated ejection fraction was in the range of 40% to 45%.   Severe hypokinesis of the mid-apicalanteroseptal, anterior, and   apical myocardium. Doppler parameters are consistent with   abnormal left ventricular relaxation (grade 1 diastolic   dysfunction). - Aortic valve: Mean gradient (S): 5 mm Hg. Valve area (VTI): 1.66   cm^2. Valve area (Vmean): 1.54 cm^2. - Mitral valve: There was mild regurgitation.  Patient Profile     66 y.o. female with known history of coronary artery disease status post three-vessel PCI's with multiple stents, hypertension, hyperlipidemia, stage III chronic kidney disease and abdominal  aortic aneurysm who presented with chest pain at rest suggestive of unstable angina.  Assessment & Plan    1. Unstable angina: Extensive history of cardiac stenting and previous inferior ST elevation myocardial infarction in 2015. She is having intermittent episodes of chest pain at rest. Echocardiogram showed mildly reduced EF with severe hypokinesis of the mid/apical anteroseptal, anterior and apical myocardium. Cardiac catheterization is scheduled for tomorrow at 7:30 PM. I discussed the procedure in detail as well as risk and benefits.  2. Severe hyperlipidemia: In spite of treatment, most recent LDL was above 200. continue rosuvastatin 40 mg once daily. I suspect that the patient will most likely require treatment with a PCSK9 inhibitor which can be initiated in the outpatient setting.  3. Tobacco use: the patient smokes 4-5 cigarettes a day. I had a prolonged discussion with her about the importance of smoking cessation.  4. Essential hypertension: Blood pressure is controlled on losartan and metoprolol.  For questions or updates, please contact [place] Please consult [email or web address] for contact info under Cardiology/STEMI.      Signed, Kathlyn Sacramento, MD  08/06/2017, 11:16 AM

## 2017-08-06 NOTE — Progress Notes (Signed)
Olivia Fields NAME: Olivia Fields    MR#:  [id number]  DATE OF BIRTH:  1951/06/04  SUBJECTIVE:   No acute events overnight.  No chest pain presently. Plan for cardiac cath tomorrow.   REVIEW OF SYSTEMS:    Review of Systems  Constitutional: Negative for chills and fever.  HENT: Negative for congestion and tinnitus.   Eyes: Negative for blurred vision and double vision.  Respiratory: Negative for cough, shortness of breath and wheezing.   Cardiovascular: Negative for chest pain, orthopnea and PND.  Gastrointestinal: Negative for abdominal pain, diarrhea, nausea and vomiting.  Genitourinary: Negative for dysuria and hematuria.  Neurological: Negative for dizziness, sensory change and focal weakness.  All other systems reviewed and are negative.   Nutrition: Heart Healthy Tolerating Diet: Yes Tolerating PT: Ambulatory   DRUG ALLERGIES:  No Known Allergies  VITALS:  Blood pressure 110/62, pulse (!) 46, temperature 98.6 F (37 C), temperature source Oral, resp. rate 18, height 5\' 1"  (1.549 m), weight 77.1 kg (170 lb), SpO2 (!) 88 %.  PHYSICAL EXAMINATION:   Physical Exam  GENERAL:  66 y.o.-year-old patient lying in bed in no acute distress.  EYES: Pupils equal, round, reactive to light and accommodation. No scleral icterus. Extraocular muscles intact.  HEENT: Head atraumatic, normocephalic. Oropharynx and nasopharynx clear.  NECK:  Supple, no jugular venous distention. No thyroid enlargement, no tenderness.  LUNGS: Normal breath sounds bilaterally, no wheezing, rales, rhonchi. No use of accessory muscles of respiration.  CARDIOVASCULAR: S1, S2 normal. No murmurs, rubs, or gallops.  ABDOMEN: Soft, nontender, nondistended. Bowel sounds present. No organomegaly or mass.  EXTREMITIES: No cyanosis, clubbing or edema b/l.    NEUROLOGIC: Cranial nerves II through XII are intact. No focal Motor or sensory deficits b/l.    PSYCHIATRIC: The patient is alert and oriented x 3.  SKIN: No obvious rash, lesion, or ulcer.    LABORATORY PANEL:   CBC  Recent Labs Lab 08/04/17 1251  WBC 6.3  HGB 13.6  HCT 39.1  PLT 161   ------------------------------------------------------------------------------------------------------------------  Chemistries   Recent Labs Lab 08/04/17 0938 08/04/17 1251  NA 140  --   K 4.4  --   CL 108  --   CO2 24  --   GLUCOSE 95  --   BUN 29*  --   CREATININE 1.43* 1.40*  CALCIUM 8.9  --    ------------------------------------------------------------------------------------------------------------------  Cardiac Enzymes  Recent Labs Lab 08/04/17 2321  TROPONINI <0.03   ------------------------------------------------------------------------------------------------------------------  RADIOLOGY:  No results found.   ASSESSMENT AND PLAN:   66 year old female with past medical history of coronary artery disease status post multiple stents in the past, restless leg syndrome, hypertension, hyperlipidemia, depression, neuropathy who presents to the hospital due to chest pain.  1. Chest pain-secondary to unstable angina. Patient has a past medical history of coronary artery disease with multiple stent placements. Her echocardiogram is worrisome for inferior apical wall hypokinesis consistent with ischemia. - Her cardiac markers so far are negative. Appreciate cardiology input. - Continue ASA, Brillinta, Metoprolol, Losartan, Crestor.  - plan for Cardiac Cath tomorrow.   2. COPD - no acute exacerbation .  - cont. Spiriva, PRN duonebs.   3. HtN - cont. Toprol, Losartan.   4. Hyperlipidemia - cont. Crestor.    5. Neuropathy - cont. Lyrica.   6. Restless leg syndrom - cont. Mirapex.   7. Depression - cont. Prozac.   All the records are reviewed and  case discussed with Care Management/Social Worker. Management plans discussed with the patient, family and they  are in agreement.  CODE STATUS: Full code  DVT Prophylaxis: Lovenox  TOTAL TIME TAKING CARE OF THIS PATIENT: 25 minutes.   POSSIBLE D/C IN 2-3 DAYS, DEPENDING ON CLINICAL CONDITION.   Henreitta Leber M.D on 08/06/2017 at 12:47 PM  Between 7am to 6pm - Pager - [phone]  After 6pm go to [email or web address] - [employer] [facility]  Office  [phone]  CC: Primary care physician; Konrad Saha, MD

## 2017-08-07 ENCOUNTER — Encounter
Admission: EM | Disposition: A | Discharge status: Home / Self Care | Payer: Self-pay | Source: Home / Self Care | Attending: Emergency Medicine

## 2017-08-07 ENCOUNTER — Encounter: Payer: Self-pay | Admitting: Cardiovascular Disease

## 2017-08-07 DIAGNOSIS — I25110 Atherosclerotic heart disease of native coronary artery with unstable angina pectoris: Secondary | ICD-10-CM | POA: Diagnosis not present

## 2017-08-07 HISTORY — PX: CORONARY STENT INTERVENTION: CATH118234

## 2017-08-07 HISTORY — PX: LEFT HEART CATH AND CORONARY ANGIOGRAPHY: CATH118249

## 2017-08-07 LAB — CBC
HCT: 42.8 % (ref 35.0–47.0)
HEMOGLOBIN: 14.8 g/dL (ref 12.0–16.0)
MCH: 30.7 pg (ref 26.0–34.0)
MCHC: 34.5 g/dL (ref 32.0–36.0)
MCV: 89 fL (ref 80.0–100.0)
Platelets: 157 10*3/uL (ref 150–440)
RBC: 4.81 MIL/uL (ref 3.80–5.20)
RDW: 13.5 % (ref 11.5–14.5)
WBC: 8.1 10*3/uL (ref 3.6–11.0)

## 2017-08-07 LAB — CREATININE, SERUM
CREATININE: 1.59 mg/dL — AB (ref 0.44–1.00)
GFR calc Af Amer: 39 mL/min — ABNORMAL LOW (ref >60)
GFR, EST NON AFRICAN AMERICAN: 33 mL/min — AB (ref >60)

## 2017-08-07 LAB — POCT ACTIVATED CLOTTING TIME: ACTIVATED CLOTTING TIME: 345 s

## 2017-08-07 SURGERY — LEFT HEART CATH AND CORONARY ANGIOGRAPHY
Anesthesia: Moderate Sedation

## 2017-08-07 MED ORDER — NITROGLYCERIN 5 MG/ML IV SOLN
INTRAVENOUS | Status: AC
Start: 2017-08-07 — End: 2017-08-07
  Filled 2017-08-07: qty 10

## 2017-08-07 MED ORDER — NITROGLYCERIN 1 MG/10 ML FOR IR/CATH LAB
INTRA_ARTERIAL | Status: DC | PRN
  Administered 2017-08-07: 100 ug via INTRACORONARY

## 2017-08-07 MED ORDER — SODIUM CHLORIDE 0.9 % IV SOLN
INTRAVENOUS | Status: AC | PRN
  Administered 2017-08-07: 250 mL via INTRAVENOUS

## 2017-08-07 MED ORDER — HEPARIN (PORCINE) IN NACL 2-0.9 UNIT/ML-% IJ SOLN
INTRAMUSCULAR | Status: AC
  Filled 2017-08-07: qty 500

## 2017-08-07 MED ORDER — VERAPAMIL HCL 2.5 MG/ML IV SOLN
INTRAVENOUS | Status: AC
Start: 2017-08-07 — End: 2017-08-07
  Filled 2017-08-07: qty 2

## 2017-08-07 MED ORDER — IOPAMIDOL (ISOVUE-300) INJECTION 61%
INTRAVENOUS | Status: DC | PRN
  Administered 2017-08-07: 95 mL via INTRA_ARTERIAL

## 2017-08-07 MED ORDER — ENOXAPARIN SODIUM 40 MG/0.4ML ~~LOC~~ SOLN
40.0000 mg | SUBCUTANEOUS | Status: DC
  Administered 2017-08-07: 40 mg via SUBCUTANEOUS
  Filled 2017-08-07: qty 0.4

## 2017-08-07 MED ORDER — SODIUM CHLORIDE 0.9 % WEIGHT BASED INFUSION
1.0000 mL/kg/h | INTRAVENOUS | Status: AC
  Administered 2017-08-07: 1 mL/kg/h via INTRAVENOUS

## 2017-08-07 MED ORDER — MIDAZOLAM HCL 2 MG/2ML IJ SOLN
INTRAMUSCULAR | Status: AC
  Filled 2017-08-07: qty 2

## 2017-08-07 MED ORDER — ASPIRIN 81 MG PO CHEW
CHEWABLE_TABLET | ORAL | Status: DC | PRN
  Administered 2017-08-07: 243 mg via ORAL

## 2017-08-07 MED ORDER — HEPARIN SODIUM (PORCINE) 1000 UNIT/ML IJ SOLN
INTRAMUSCULAR | Status: DC | PRN
  Administered 2017-08-07 (×2): 4000 [IU] via INTRAVENOUS

## 2017-08-07 MED ORDER — TICAGRELOR 90 MG PO TABS
ORAL_TABLET | ORAL | Status: DC | PRN
  Administered 2017-08-07: 90 mg via ORAL

## 2017-08-07 MED ORDER — TICAGRELOR 90 MG PO TABS
ORAL_TABLET | ORAL | Status: AC
Start: 2017-08-07 — End: 2017-08-07
  Filled 2017-08-07: qty 1

## 2017-08-07 MED ORDER — LIDOCAINE HCL (PF) 1 % IJ SOLN
INTRAMUSCULAR | Status: AC
  Filled 2017-08-07: qty 30

## 2017-08-07 MED ORDER — SODIUM CHLORIDE 0.9% FLUSH: 3.0000 mL | Freq: Two times a day (BID) | INTRAVENOUS | Status: DC

## 2017-08-07 MED ORDER — VERAPAMIL HCL 2.5 MG/ML IV SOLN
INTRAVENOUS | Status: DC | PRN
Start: 2017-08-07 — End: 2017-08-07
  Administered 2017-08-07: 2.5 mg via INTRA_ARTERIAL

## 2017-08-07 MED ORDER — MIDAZOLAM HCL 2 MG/2ML IJ SOLN
INTRAMUSCULAR | Status: DC | PRN
  Administered 2017-08-07: 1 mg via INTRAVENOUS

## 2017-08-07 MED ORDER — HEPARIN SODIUM (PORCINE) 1000 UNIT/ML IJ SOLN
INTRAMUSCULAR | Status: AC
Start: 2017-08-07 — End: 2017-08-07
  Filled 2017-08-07: qty 1

## 2017-08-07 MED ORDER — FENTANYL CITRATE (PF) 100 MCG/2ML IJ SOLN
INTRAMUSCULAR | Status: AC
  Filled 2017-08-07: qty 2

## 2017-08-07 MED ORDER — SODIUM CHLORIDE 0.9% FLUSH: 3.0000 mL | INTRAVENOUS | Status: DC | PRN

## 2017-08-07 MED ORDER — SODIUM CHLORIDE 0.9 % IV SOLN: 250.0000 mL | INTRAVENOUS | Status: DC | PRN

## 2017-08-07 MED ORDER — LIDOCAINE HCL (PF) 1 % IJ SOLN
INTRAMUSCULAR | Status: DC | PRN
  Administered 2017-08-07: 2 mL via INTRADERMAL

## 2017-08-07 MED ORDER — FENTANYL CITRATE (PF) 100 MCG/2ML IJ SOLN
INTRAMUSCULAR | Status: DC | PRN
  Administered 2017-08-07: 25 ug via INTRAVENOUS

## 2017-08-07 SURGICAL SUPPLY — 14 items
BALLN TREK RX 2.5X15 (BALLOONS) ×3
BALLN ~~LOC~~ EUPHORA RX 2.75X15 (BALLOONS) ×3
BALLOON TREK RX 2.5X15 (BALLOONS) IMPLANT
BALLOON ~~LOC~~ EUPHORA RX 2.75X15 (BALLOONS) IMPLANT
CATH OPTITORQUE JACKY 4.0 5F (CATHETERS) ×2 IMPLANT
CATH VISTA GUIDE 6FR JR4 (CATHETERS) ×2 IMPLANT
DEVICE INFLAT 30 PLUS (MISCELLANEOUS) ×2 IMPLANT
DEVICE RAD TR BAND REGULAR (VASCULAR PRODUCTS) ×2 IMPLANT
GLIDESHEATH SLEND SS 6F .021 (SHEATH) ×2 IMPLANT
KIT MANI 3VAL PERCEP (MISCELLANEOUS) ×3 IMPLANT
PACK CARDIAC CATH (CUSTOM PROCEDURE TRAY) ×3 IMPLANT
STENT RESOLUTE ONYX 2.75X18 (Permanent Stent) ×2 IMPLANT
WIRE ROSEN-J .035X260CM (WIRE) ×2 IMPLANT
WIRE RUNTHROUGH .014X180CM (WIRE) ×2 IMPLANT

## 2017-08-07 NOTE — Plan of Care (Signed)
Problem: Cardiac: Goal: Ability to achieve and maintain adequate cardiovascular perfusion will improve Outcome: Progressing Pain free.  Right radial site without complications.

## 2017-08-07 NOTE — Progress Notes (Signed)
[place] [facility] [place] NAME: Olivia Fields    MR#:  [id number]  DATE OF BIRTH:  03-29-1951  SUBJECTIVE:   No acute events overnight.  No chest pain presently.  Status post cardiac catheterization today with a drug-eluting stent to the mid RCA.  Pt. Has in-stent restenosis.  Currently CP free.   REVIEW OF SYSTEMS:    Review of Systems  Constitutional: Negative for chills and fever.  HENT: Negative for congestion and tinnitus.   Eyes: Negative for blurred vision and double vision.  Respiratory: Negative for cough, shortness of breath and wheezing.   Cardiovascular: Negative for chest pain, orthopnea and PND.  Gastrointestinal: Negative for abdominal pain, diarrhea, nausea and vomiting.  Genitourinary: Negative for dysuria and hematuria.  Neurological: Negative for dizziness, sensory change and focal weakness.  All other systems reviewed and are negative.   Nutrition: Heart Healthy Tolerating Diet: Yes Tolerating PT: Ambulatory   DRUG ALLERGIES:  No Known Allergies  VITALS:  Blood pressure 111/63, pulse 62, temperature 97.6 F (36.4 C), temperature source Oral, resp. rate 18, height 5\' 1"  (1.549 m), weight 74.8 kg (165 lb), SpO2 99 %.  PHYSICAL EXAMINATION:   Physical Exam  GENERAL:  66 y.o.-year-old patient lying in bed in no acute distress.  EYES: Pupils equal, round, reactive to light and accommodation. No scleral icterus. Extraocular muscles intact.  HEENT: Head atraumatic, normocephalic. Oropharynx and nasopharynx clear.  NECK:  Supple, no jugular venous distention. No thyroid enlargement, no tenderness.  LUNGS: Normal breath sounds bilaterally, no wheezing, rales, rhonchi. No use of accessory muscles of respiration.  CARDIOVASCULAR: S1, S2 normal. No murmurs, rubs, or gallops.  ABDOMEN: Soft, nontender, nondistended. Bowel sounds present. No organomegaly or mass.  EXTREMITIES: No cyanosis, clubbing or edema b/l.     NEUROLOGIC: Cranial nerves II through XII are intact. No focal Motor or sensory deficits b/l.   PSYCHIATRIC: The patient is alert and oriented x 3.  SKIN: No obvious rash, lesion, or ulcer.    LABORATORY PANEL:   CBC  Recent Labs Lab 08/07/17 0623  WBC 8.1  HGB 14.8  HCT 42.8  PLT 157   ------------------------------------------------------------------------------------------------------------------  Chemistries   Recent Labs Lab 08/04/17 0938  08/07/17 0623  NA 140  --   --   K 4.4  --   --   CL 108  --   --   CO2 24  --   --   GLUCOSE 95  --   --   BUN 29*  --   --   CREATININE 1.43*  < > 1.59*  CALCIUM 8.9  --   --   < > = values in this interval not displayed. ------------------------------------------------------------------------------------------------------------------  Cardiac Enzymes  Recent Labs Lab 08/04/17 2321  TROPONINI <0.03   ------------------------------------------------------------------------------------------------------------------  RADIOLOGY:  No results found.   ASSESSMENT AND PLAN:   66 year old female with past medical history of coronary artery disease status post multiple stents in the past, restless leg syndrome, hypertension, hyperlipidemia, depression, neuropathy who presents to the hospital due to chest pain.  1. Chest pain-secondary to unstable angina. Patient has a past medical history of coronary artery disease with multiple stent placements. Her echocardiogram is worrisome for inferior apical wall hypokinesis consistent with ischemia. - patient is status post cardiac catheterization today with drug-eluting stent placement to the mid RCA. Currently chest pain-free and hemodynamically - Continue ASA, Brillinta, Metoprolol, Losartan, Crestor.  - likely discharge home tomorrow.  2. COPD -  no acute exacerbation .  - cont. Spiriva, PRN duonebs.   3. HtN - cont. Toprol, Losartan.   4. Hyperlipidemia - cont. Crestor.     5. Neuropathy - cont. Lyrica.   6. Restless leg syndrom - cont. Mirapex.   7. Depression - cont. Prozac.   All the records are reviewed and case discussed with Care Management/Social Worker. Management plans discussed with the patient, family and they are in agreement.  CODE STATUS: Full code  DVT Prophylaxis: Lovenox  TOTAL TIME TAKING CARE OF THIS PATIENT: 25 minutes.   POSSIBLE D/C IN 1-2 DAYS, DEPENDING ON CLINICAL CONDITION.   Henreitta Leber M.D on 08/07/2017 at 2:52 PM  Between 7am to 6pm - Pager - [phone]  After 6pm go to [email or web address] - [employer] [facility] [facility]  Office  [phone]  CC: Primary care physician; Konrad Saha, MD

## 2017-08-07 NOTE — H&P (View-Only) (Signed)
Progress Note  Patient Name: Olivia Fields Date of Encounter: 08/06/2017  Primary Cardiologist: Fletcher Anon  Subjective   She reports feeling better overall although she continues to have intermittent substernal chest pain at rest.  Inpatient Medications    Scheduled Meds: . aspirin  81 mg Oral Daily  . [START ON 08/07/2017] aspirin  81 mg Oral Pre-Cath  . enoxaparin (LOVENOX) injection  40 mg Subcutaneous Q24H  . famotidine  20 mg Oral Daily  . FLUoxetine  40 mg Oral Daily  . Influenza vac split quadrivalent PF  0.5 mL Intramuscular Tomorrow-1000  . losartan  25 mg Oral Daily  . metoprolol succinate  25 mg Oral Daily  . pramipexole  0.125 mg Oral QHS  . pregabalin  150 mg Oral BID  . rosuvastatin  40 mg Oral q1800  . sodium chloride flush  3 mL Intravenous Q12H  . sodium chloride flush  3 mL Intravenous Q12H  . ticagrelor  90 mg Oral Q12H  . tiotropium  18 mcg Inhalation Daily   Continuous Infusions: . sodium chloride    . sodium chloride     PRN Meds: sodium chloride, acetaminophen, fluticasone, furosemide, ipratropium-albuterol, loratadine, nitroGLYCERIN, ondansetron (ZOFRAN) IV, sodium chloride flush, traMADol   Vital Signs    Vitals:   08/05/17 2048 08/06/17 0504 08/06/17 0907 08/06/17 0922  BP: 107/66 109/63 (!) 110/50   Pulse: (!) 57 63 (!) 58 (!) 59  Resp:  18 18   Temp: 97.6 F (36.4 C) 98.5 F (36.9 C) (!) 97.5 F (36.4 C)   TempSrc: Oral Oral Oral   SpO2: 96% 95% 98%   Weight:      Height:        Intake/Output Summary (Last 24 hours) at 08/06/17 1116 Last data filed at 08/06/17 1025  Gross per 24 hour  Intake              483 ml  Output             2420 ml  Net            -1937 ml   Filed Weights   08/04/17 0939 08/04/17 1131  Weight: 160 lb (72.6 kg) 170 lb (77.1 kg)    Telemetry    Sinus bradycardia - Personally Reviewed  ECG    Sinus rhythm with poor R-wave progression in the anterior leads - Personally Reviewed  Physical Exam     GEN: No acute distress.   Neck: No JVD Cardiac: RRR, no rubs, or gallops. 1/6 systolic ejection murmur in the aortic area Respiratory: Clear to auscultation bilaterally. GI: Soft, nontender, non-distended  MS: No edema; No deformity. Neuro:  Nonfocal  Psych: Normal affect   Labs    Chemistry  Recent Labs Lab 08/04/17 0938 08/04/17 1251  NA 140  --   K 4.4  --   CL 108  --   CO2 24  --   GLUCOSE 95  --   BUN 29*  --   CREATININE 1.43* 1.40*  CALCIUM 8.9  --   GFRNONAA 38* 39*  GFRAA 44* 45*  ANIONGAP 8  --      Hematology  Recent Labs Lab 08/04/17 0938 08/04/17 1251  WBC 5.8 6.3  RBC 4.63 4.45  HGB 14.1 13.6  HCT 41.4 39.1  MCV 89.4 87.9  MCH 30.4 30.6  MCHC 34.0 34.8  RDW 13.6 13.4  PLT 160 161    Cardiac Enzymes  Recent Labs Lab 08/04/17 0938 08/04/17  1251 08/04/17 1733 08/04/17 2321  TROPONINI <0.03 <0.03 <0.03 <0.03   No results for input(s): TROPIPOC in the last 168 hours.   BNPNo results for input(s): BNP, PROBNP in the last 168 hours.   DDimer No results for input(s): DDIMER in the last 168 hours.   Radiology    No results found.  Cardiac Studies   Echocardiogram on September 21: - Left ventricle: The cavity size was normal. Wall thickness was   normal. Systolic function was mildly to moderately reduced. The   estimated ejection fraction was in the range of 40% to 45%.   Severe hypokinesis of the mid-apicalanteroseptal, anterior, and   apical myocardium. Doppler parameters are consistent with   abnormal left ventricular relaxation (grade 1 diastolic   dysfunction). - Aortic valve: Mean gradient (S): 5 mm Hg. Valve area (VTI): 1.66   cm^2. Valve area (Vmean): 1.54 cm^2. - Mitral valve: There was mild regurgitation.  Patient Profile     66 y.o. female with known history of coronary artery disease status post three-vessel PCI's with multiple stents, hypertension, hyperlipidemia, stage III chronic kidney disease and abdominal  aortic aneurysm who presented with chest pain at rest suggestive of unstable angina.  Assessment & Plan    1. Unstable angina: Extensive history of cardiac stenting and previous inferior ST elevation myocardial infarction in 2015. She is having intermittent episodes of chest pain at rest. Echocardiogram showed mildly reduced EF with severe hypokinesis of the mid/apical anteroseptal, anterior and apical myocardium. Cardiac catheterization is scheduled for tomorrow at 7:30 PM. I discussed the procedure in detail as well as risk and benefits.  2. Severe hyperlipidemia: In spite of treatment, most recent LDL was above 200. continue rosuvastatin 40 mg once daily. I suspect that the patient will most likely require treatment with a PCSK9 inhibitor which can be initiated in the outpatient setting.  3. Tobacco use: the patient smokes 4-5 cigarettes a day. I had a prolonged discussion with her about the importance of smoking cessation.  4. Essential hypertension: Blood pressure is controlled on losartan and metoprolol.  For questions or updates, please contact [place] Please consult [email or web address] for contact info under Cardiology/STEMI.      Signed, Kathlyn Sacramento, MD  08/06/2017, 11:16 AM

## 2017-08-07 NOTE — Care Management Obs Status (Signed)
MEDICARE OBSERVATION STATUS NOTIFICATION   Patient Details  Name: SHANNIE KONTOS MRN: [id number] Date of Birth: 02-05-1951   Medicare Observation Status Notification Given:  Yes  Placed in observation 9/21 Notice was not given over the weekend 9/22 or 9/23. Patient was not on CM census this morning when printed because patients do not show as being on the unit when off the unit for testing- this patient having cardiac cath.   Provided notice when back on unit but had had conscious sedation medications so could not ask for signature.  Explained the notice and patient verbalized understanding    Katrina Stack, RN 08/07/2017, 4:53 PM

## 2017-08-07 NOTE — Progress Notes (Signed)
TR band removed, no bleeding noted at this time, site dressed with gauze and tegaderm.  Pt instructed dressing will stay in place x 2 days.  PT verbalizes understanding.

## 2017-08-07 NOTE — Interval H&P Note (Signed)
History and Physical Interval Note:  08/07/2017 7:49 AM  Olivia Fields  has presented today for surgery, with the diagnosis of unstable angina  The various methods of treatment have been discussed with the patient and family. After consideration of risks, benefits and other options for treatment, the patient has consented to  Procedure(s): LEFT HEART CATH AND CORONARY ANGIOGRAPHY (N/A) as a surgical intervention .  The patient's history has been reviewed, patient examined, no change in status, stable for surgery.  I have reviewed the patient's chart and labs.  Questions were answered to the patient's satisfaction.     Kathlyn Sacramento

## 2017-08-08 ENCOUNTER — Telehealth: Payer: Self-pay | Admitting: Cardiovascular Disease

## 2017-08-08 DIAGNOSIS — Z720 Tobacco use: Secondary | ICD-10-CM | POA: Diagnosis not present

## 2017-08-08 DIAGNOSIS — E785 Hyperlipidemia, unspecified: Secondary | ICD-10-CM

## 2017-08-08 DIAGNOSIS — I200 Unstable angina: Secondary | ICD-10-CM | POA: Diagnosis not present

## 2017-08-08 LAB — CBC
HCT: 40.6 % (ref 35.0–47.0)
Hemoglobin: 13.9 g/dL (ref 12.0–16.0)
MCH: 30 pg (ref 26.0–34.0)
MCHC: 34.2 g/dL (ref 32.0–36.0)
MCV: 87.6 fL (ref 80.0–100.0)
PLATELETS: 165 10*3/uL (ref 150–440)
RBC: 4.63 MIL/uL (ref 3.80–5.20)
RDW: 13.4 % (ref 11.5–14.5)
WBC: 6.7 10*3/uL (ref 3.6–11.0)

## 2017-08-08 LAB — BASIC METABOLIC PANEL
ANION GAP: 5 (ref 5–15)
BUN: 32 mg/dL — ABNORMAL HIGH (ref 6–20)
CALCIUM: 9.2 mg/dL (ref 8.9–10.3)
CO2: 24 mmol/L (ref 22–32)
CREATININE: 1.38 mg/dL — AB (ref 0.44–1.00)
Chloride: 109 mmol/L (ref 101–111)
GFR calc Af Amer: 46 mL/min — ABNORMAL LOW (ref >60)
GFR, EST NON AFRICAN AMERICAN: 40 mL/min — AB (ref >60)
GLUCOSE: 105 mg/dL — AB (ref 65–99)
Potassium: 4.6 mmol/L (ref 3.5–5.1)
Sodium: 138 mmol/L (ref 135–145)

## 2017-08-08 MED ORDER — ROSUVASTATIN CALCIUM 40 MG PO TABS: 40.0000 mg | ORAL_TABLET | Freq: Every day | ORAL | 1 refills | Status: AC

## 2017-08-08 MED ORDER — INFLUENZA VAC SPLIT QUAD 0.5 ML IM SUSY: 0.5000 mL | PREFILLED_SYRINGE | INTRAMUSCULAR | Status: DC

## 2017-08-08 MED ORDER — INFLUENZA VAC SPLIT QUAD 0.5 ML IM SUSY
0.5000 mL | PREFILLED_SYRINGE | Freq: Once | INTRAMUSCULAR | Status: AC
  Administered 2017-08-08: 0.5 mL via INTRAMUSCULAR
  Filled 2017-08-08: qty 0.5

## 2017-08-08 NOTE — Telephone Encounter (Signed)
TCM 10/2 8:00 Dr. Fletcher Anon  Pt currently admitted

## 2017-08-08 NOTE — Progress Notes (Signed)
Progress Note  Patient Name: Olivia Fields Date of Encounter: 08/08/2017  Primary Cardiologist: Jerilynn Mages. Fletcher Anon, MD   Subjective   Patient feels well this morning. She had a 30 minute episode of chest tightness that awoke her around 3 AM. Pain resolved spontaneously. She did not alert anyone about this.  Inpatient Medications    Scheduled Meds: . aspirin  81 mg Oral Daily  . enoxaparin (LOVENOX) injection  40 mg Subcutaneous Q24H  . famotidine  20 mg Oral Daily  . FLUoxetine  40 mg Oral Daily  . Influenza vac split quadrivalent PF  0.5 mL Intramuscular Tomorrow-1000  . losartan  25 mg Oral Daily  . metoprolol succinate  25 mg Oral Daily  . pramipexole  0.125 mg Oral QHS  . pregabalin  150 mg Oral BID  . rosuvastatin  40 mg Oral q1800  . sodium chloride flush  3 mL Intravenous Q12H  . sodium chloride flush  3 mL Intravenous Q12H  . ticagrelor  90 mg Oral Q12H  . tiotropium  18 mcg Inhalation Daily   Continuous Infusions: . sodium chloride     PRN Meds: sodium chloride, acetaminophen, fluticasone, furosemide, ipratropium-albuterol, loratadine, nitroGLYCERIN, ondansetron (ZOFRAN) IV, sodium chloride flush, traMADol   Vital Signs    Vitals:   08/07/17 1230 08/07/17 1937 08/08/17 0539 08/08/17 0858  BP: 111/63 110/67 93/60 (!) 119/98  Pulse: 62 66 (!) 53 (!) 52  Resp: 18 18 18 16   Temp:  98.6 F (37 C) 98 F (36.7 C)   TempSrc:  Oral Oral   SpO2: 99% 100% 98% 99%  Weight:      Height:        Intake/Output Summary (Last 24 hours) at 08/08/17 0907 Last data filed at 08/08/17 0230  Gross per 24 hour  Intake              360 ml  Output             1050 ml  Net             -690 ml   Filed Weights   08/04/17 0939 08/04/17 1131 08/07/17 0720  Weight: 160 lb (72.6 kg) 170 lb (77.1 kg) 165 lb (74.8 kg)    Physical Exam   General:  Well-developed and well-nourished woman, seated firmly embedded. HEENT: No conjunctival pallor or scleral icterus.  Moist mucous  membranes.  OP clear. Neck: Supple without lymphadenopathy, thyromegaly, JVD, or HJR.  No carotid bruit. Lungs: Normal work of breathing.  Clear to auscultation bilaterally without wheezes or crackles. Heart: Regular rate and rhythm without murmurs, rubs, or gallops.  Non-displaced PMI. Abd: Bowel sounds present.  Soft, NT/ND without hepatosplenomegaly Ext: No lower extremity edema.  Radial, PT, and DP pulses are 2+ bilaterally. Right radial arteriotomy site intact, covered with clean dressing. Skin: Warm and dry without rash.   Labs    Chemistry Recent Labs Lab 08/04/17 [phone] 08/04/17 1251 08/07/17 0623 08/08/17 0416  NA 140  --   --  138  K 4.4  --   --  4.6  CL 108  --   --  109  CO2 24  --   --  24  GLUCOSE 95  --   --  105*  BUN 29*  --   --  32*  CREATININE 1.43* 1.40* 1.59* 1.38*  CALCIUM 8.9  --   --  9.2  GFRNONAA 38* 39* 33* 40*  GFRAA 44* 45* 39* 46*  ANIONGAP  8  --   --  5     Hematology Recent Labs Lab 08/04/17 1251 08/07/17 0623 08/08/17 0416  WBC 6.3 8.1 6.7  RBC 4.45 4.81 4.63  HGB 13.6 14.8 13.9  HCT 39.1 42.8 40.6  MCV 87.9 89.0 87.6  MCH 30.6 30.7 30.0  MCHC 34.8 34.5 34.2  RDW 13.4 13.5 13.4  PLT 161 157 165    Cardiac Enzymes Recent Labs Lab 08/04/17 0938 08/04/17 1251 08/04/17 1733 08/04/17 2321  TROPONINI <0.03 <0.03 <0.03 <0.03      Radiology    No results found.  Telemetry    Normal sinus rhythm - Personally Reviewed  ECG    Sinus brady, 53, inf infarct - Personally Reviewed  Cardiac Studies   Echocardiogram on September 21: - Left ventricle: The cavity size was normal. Wall thickness was normal. Systolic function was mildly to moderately reduced. The estimated ejection fraction was in the range of 40% to 45%. Severe hypokinesis of the mid-apicalanteroseptal, anterior, and apical myocardium. Doppler parameters are consistent with abnormal left ventricular relaxation (grade 1 diastolic dysfunction). -  Aortic valve: Mean gradient (S): 5 mm Hg. Valve area (VTI): 1.66 cm^2. Valve area (Vmean): 1.54 cm^2. - Mitral valve: There was mild regurgitation.  Cardiac catheterization on September 24: Significant underlying 3 vessel disease with patent stents in the LAD and LCx. Severe in-stent restenosis of the mid RCA. Mildly reduced LVEF (45-50%) with normal LVEDP. Successful PCI to the mid RCA with placement of a resolute onyx 2.75 x 18 mm drug-eluting stent.  Patient Profile     66 y.o. female with known history of coronary artery disease status post three-vessel PCI's with multiple stents, hypertension, hyperlipidemia, stage III chronic kidney disease and abdominal aortic aneurysm who presented with chest pain at rest suggestive of unstable angina.  Assessment & Plan    1. USA/CAD:  S/p prior multivessel stenting.  Presented the other day with a several wk h/o progressive angina and dyspnea.  Cath yesterday revealed severe mid RCA dzs/ISR  s/p DES. Patient had one episode of chest tightness overnight consistent with her prior angina. Repeat EKG shows resolution of inferior T-wave inversions. We will continue with indefinite DAPT with aspirin and ticagrelor. Continue current dose of metoprolol. We discussed addition of isosorbide mononitrate and or ranolazine; patient would like to defer this when she has recurrent pain.  2.  HL:  LDL 203 in January but was 63 in 02/2014.  Cont rosuvastatin with plan for outpt re-eval and likely PCSK9 if it remains >70.  3.  Tob Abuse:  Cessation advised.  4.  Essential HTN:  Stable on  blocker and ARB.  5.  CKD III: creat stable.  Cont arb.  6.  AAA:  4.4 CM by CTA 02/2017 - up from 3.7 cm in 2016.  BP well controlled.  Will need f/u in 2019.  7.  COPD:  Smoking cessation advised.  Disposition: It is appropriate for the patient to be discharged today if she is able to ambulate without recurrent chest pain. She has follow-up scheduled with Ignacia Bayley, NP, on  08/15/17.  Signed, Nelva Bush, MD [place] Pager: [phone]

## 2017-08-08 NOTE — Discharge Summary (Signed)
[place] [facility] [place] NAME: Olivia Fields    MR#:  [id number]  DATE OF BIRTH:  10/20/1951  DATE OF ADMISSION:  08/04/2017 ADMITTING PHYSICIAN: Nicholes Mango, MD  DATE OF DISCHARGE: 08/08/2017 11:40 AM  PRIMARY CARE PHYSICIAN: Konrad Saha, MD    ADMISSION DIAGNOSIS:  Chest pain, unspecified type [R07.9]  DISCHARGE DIAGNOSIS:  Active Problems:   Chest pain   SECONDARY DIAGNOSIS:   Past Medical History:  Diagnosis Date  . AAA (abdominal aortic aneurysm) ([place])    a. 08/2015 Abd U/S: 3.7cm AAA; b. 02/2017 CTA Abd/Pelvis: 4.4 cm infrarenal AAA.  Marland Kitchen Cardiogenic shock ([place])   . Chronic diastolic CHF (congestive heart failure) ([place] [facility] [place])    a. 02/2014 Echo: EF 50-55%, mod inf/inflat HK, diast dysfxn, mild Ao sclerosis w/o stenosis.  . CKD (chronic kidney disease), stage III   . COPD (chronic obstructive pulmonary disease) ([place])   . Coronary artery disease    a. Previous stenting of LAD, LCx and RCA at [facility];  b. 02/2014 Inf STEMI/PCI: LM nl, LAD 10p/d patent stent, LCX patent stent, 20/32m, OM2 30, OM3 30, RCA 70p, 120m/d (3.5x12, 2.75x18, 2.5x12 Xience Alpine DESs).  . H/O ovarian cancer 1990  . Hyperlipidemia   . Hypertension   . MI (myocardial infarction) ([place]) 2002, 2009, 2015    HOSPITAL COURSE:   66 year old female with past medical history of coronary artery disease status post multiple stents in the past, restless leg syndrome, hypertension, hyperlipidemia, depression, neuropathy who presents to the hospital due to chest pain.  1. Chest pain-secondary to unstable angina. Patient has a past medical history of coronary artery disease with multiple stent placements. Her echocardiogram was worrisome for inferior apical wall hypokinesis consistent with ischemia. - Patient was seen by cardiology (Dr. Fletcher Anon) and is status post cardiac catheterization on 9/24 with drug-eluting stent placement to the mid RCA.  Post cardiac catheterization patient  had some intermittent chest pain but on ambulation she was asymptomatic. The cardiologist (Dr. Nutter Revel) discussed with her about possibly starting Her on some Ranexa or imdur but she will discuss this with Cardiology as outpatient.  - she will Continue ASA, Brillinta, Metoprolol, Losartan, Crestor.   2. COPD - no acute exacerbation .  - she will cont. Spiriva, Combivent.   3. HtN - she will cont. Toprol, Losartan.   4. Hyperlipidemia - she will cont. Crestor.    5. Neuropathy - she will cont. Lyrica.   6. Restless leg syndrom - she will cont. Mirapex.   7. Depression - she will cont. Prozac.   DISCHARGE CONDITIONS:   Stable.   CONSULTS OBTAINED:  Treatment Team:  Minna Merritts, MD  DRUG ALLERGIES:  No Known Allergies  DISCHARGE MEDICATIONS:   Allergies as of 08/08/2017   No Known Allergies     Medication List    STOP taking these medications   atorvastatin 80 MG tablet Commonly known as:  LIPITOR   Ciprofloxacin HCl 0.2 % otic solution   gabapentin 300 MG capsule Commonly known as:  NEURONTIN     TAKE these medications   aspirin 81 MG tablet Take 81 mg by mouth daily.   BRILINTA 90 MG Tabs tablet Generic drug:  ticagrelor TAKE 1 TABLET BY MOUTH EVERY 12 HOURS   cetirizine 10 MG tablet Commonly known as:  ZYRTEC Take 10 mg by mouth daily. Notes to patient:  None given today   FLUoxetine 40 MG capsule Commonly known as:  PROZAC Take  1 capsule (40 mg total) by mouth daily.   fluticasone 50 MCG/ACT nasal spray Commonly known as:  FLONASE Place 1 spray into both nostrils daily. Notes to patient:  None given today   furosemide 40 MG tablet Commonly known as:  LASIX Take 1 tablet (40 mg total) by mouth as needed for fluid or edema.   Ipratropium-Albuterol 20-100 MCG/ACT Aers respimat Commonly known as:  COMBIVENT Inhale 1 puff into the lungs as needed for wheezing.   losartan 25 MG tablet Commonly known as:  COZAAR Take 1 tablet (25 mg total)  by mouth daily.   metoprolol succinate 25 MG 24 hr tablet Commonly known as:  TOPROL-XL Take 1 tablet (25 mg total) by mouth daily.   nitroGLYCERIN 0.4 MG SL tablet Commonly known as:  NITROSTAT Place 0.4 mg under the tongue every 5 (five) minutes as needed for chest pain.   pramipexole 0.125 MG tablet Commonly known as:  MIRAPEX Take 1 tablet (0.125 mg total) by mouth at bedtime. Take 2-3 hours before bedtime. After seven days can take 2 pills 2-3 hours before bedtime.   pregabalin 150 MG capsule Commonly known as:  LYRICA Take 150 mg by mouth 2 (two) times daily.   ranitidine 150 MG tablet Commonly known as:  ZANTAC Take 1 tablet (150 mg total) by mouth 2 (two) times daily. Notes to patient:  None given today   rosuvastatin 40 MG tablet Commonly known as:  CRESTOR Take 1 tablet (40 mg total) by mouth daily at 6 PM.   tiotropium 18 MCG inhalation capsule Commonly known as:  SPIRIVA HANDIHALER INHALE CONTENTS OF 1 CAPSULE ONCE DAILY USING HANDIHALER   traMADol 50 MG tablet Commonly known as:  ULTRAM TAKE 1 TABLET BY MOUTH EVERY 8 HOURS AS NEEDED            Discharge Care Instructions        Start     Ordered   08/08/17 0000  rosuvastatin (CRESTOR) 40 MG tablet  Daily-1800     08/08/17 0920   08/08/17 0000  Activity as tolerated - No restrictions     08/08/17 0920   08/08/17 0000  Diet - low sodium heart healthy     08/08/17 0920   08/07/17 0000  AMB Referral to Cardiac Rehabilitation - Phase II    Question:  Diagnosis:  Answer:  Coronary Stents   08/07/17 0848        DISCHARGE INSTRUCTIONS:   DIET:  Cardiac diet  DISCHARGE CONDITION:  Stable  ACTIVITY:  Activity as tolerated  OXYGEN:  Home Oxygen: No.   Oxygen Delivery: room air  DISCHARGE LOCATION:  home   If you experience worsening of your admission symptoms, develop shortness of breath, life threatening emergency, suicidal or homicidal thoughts you must seek medical attention  immediately by calling 911 or calling your MD immediately  if symptoms less severe.  You Must read complete instructions/literature along with all the possible adverse reactions/side effects for all the Medicines you take and that have been prescribed to you. Take any new Medicines after you have completely understood and accpet all the possible adverse reactions/side effects.   Please note  You were cared for by a hospitalist during your hospital stay. If you have any questions about your discharge medications or the care you received while you were in the hospital after you are discharged, you can call the unit and asked to speak with the hospitalist on call if the hospitalist that took care  of you is not available. Once you are discharged, your primary care physician will handle any further medical issues. Please note that NO REFILLS for any discharge medications will be authorized once you are discharged, as it is imperative that you return to your primary care physician (or establish a relationship with a primary care physician if you do not have one) for your aftercare needs so that they can reassess your need for medications and monitor your lab values.     Today   Currently chest pain-free. Hemodynamically stable. Ambulated did not have any chest pain. To be discharged home today with follow-up with cardiology.  VITAL SIGNS:  Blood pressure (!) 119/98, pulse (!) 52, temperature 98 F (36.7 C), temperature source Oral, resp. rate 16, height 5\' 1"  (1.549 m), weight 74.8 kg (165 lb), SpO2 99 %.  I/O:   Intake/Output Summary (Last 24 hours) at 08/08/17 1445 Last data filed at 08/08/17 0948  Gross per 24 hour  Intake              600 ml  Output             1050 ml  Net             -450 ml    PHYSICAL EXAMINATION:  GENERAL:  66 y.o.-year-old patient lying in the bed with no acute distress.  EYES: Pupils equal, round, reactive to light and accommodation. No scleral icterus.  Extraocular muscles intact.  HEENT: Head atraumatic, normocephalic. Oropharynx and nasopharynx clear.  NECK:  Supple, no jugular venous distention. No thyroid enlargement, no tenderness.  LUNGS: Normal breath sounds bilaterally, no wheezing, rales,rhonchi. No use of accessory muscles of respiration.  CARDIOVASCULAR: S1, S2 normal. No murmurs, rubs, or gallops.  ABDOMEN: Soft, non-tender, non-distended. Bowel sounds present. No organomegaly or mass.  EXTREMITIES: No pedal edema, cyanosis, or clubbing.  NEUROLOGIC: Cranial nerves II through XII are intact. No focal motor or sensory defecits b/l.  PSYCHIATRIC: The patient is alert and oriented x 3.  SKIN: No obvious rash, lesion, or ulcer.   DATA REVIEW:   CBC  Recent Labs Lab 08/08/17 0416  WBC 6.7  HGB 13.9  HCT 40.6  PLT 165    Chemistries   Recent Labs Lab 08/08/17 0416  NA 138  K 4.6  CL 109  CO2 24  GLUCOSE 105*  BUN 32*  CREATININE 1.38*  CALCIUM 9.2    Cardiac Enzymes  Recent Labs Lab 08/04/17 2321  TROPONINI <0.03      RADIOLOGY:  No results found.    Management plans discussed with the patient, family and they are in agreement.  CODE STATUS:  Code Status History    Date Active Date Inactive Code Status Order ID Comments User Context   08/04/2017 11:33 AM 08/08/2017  2:45 PM Full Code [id number]  Nicholes Mango, MD Inpatient      TOTAL TIME TAKING CARE OF THIS PATIENT: 40 minutes.    Henreitta Leber M.D on 08/08/2017 at 2:45 PM  Between 7am to 6pm - Pager - [phone]  After 6pm go to [email or web address] - Technical bre[employer] [place] [facility]  Office  [phone]  CC: Primary care physician; Konrad Saha, MD

## 2017-08-08 NOTE — Plan of Care (Signed)
Problem: Cardiac: Goal: Ability to achieve and maintain adequate cardiovascular perfusion will improve Outcome: Not Progressing Pt reports having chest pain earlier tonight and did not rest well.  Did not notify nursing staff. Sleeping on rounding checks.   Reports no pain at this time.

## 2017-08-08 NOTE — Progress Notes (Addendum)
Patient admitted with dx of unstable angina.  Patient with history of multiple stents in the past as well as previous STEMI in 2015.  Patient has COPD, HLD, HTN, Neuropathy, Restless Leg Syndrome, and Depression and is a current smoker.  Patient smokes 4-5 cigarettes per day. Patient underwent cardiac catheterization yesterday with placement of DES in mid RCA.   Patient on the following cardiac meds:  ASA, Brilinta, Metoprolol, Losartan, Crestor.  Patient is on Spiriva and PRN Duonebs for COPD, Lyrica for Neuropathy, Mirapex for RLS, and Prozac for Depression.  Patient sitting up on the  side of the bed preparing for discharge when this nurse entered the room.  Reviewed with patient the location of stent placement.  Upon asking patient if she had any questions about her hospitalization or treatment provided, patient stated, "No, I understand.  I have had several stents in the past."  Discussed exercise with patient.  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  In addition, patient has 5 grandchildren in the home with one being autistic.  Patient stated her grandchildren keep her active.  Benefits of regular exercise discussed with patient.  Explained to patient that Dr. Fletcher Anon has referred her to the Cardiac Rehab program.  Patient stated she has never participated in Cardiac Rehab.  An overview of the program was provided.  Upon sharing the times of the classes, patient stated she thought she might be able to come to the 4 p.m. Class.  Patient scheduled for orientation on Monday, August 21, 2017 at 10:30 a.m.  Orientation packet given to patient.   Note:  Patient also stated she has Silver Sneakers with her [facility], but she has never used it.  I explained to patient prior to discharge from the Cardiac Rehab program the Exercise Physiologists would discuss with her an ongoing exercise plan.     Roanna Epley, RN, BSN,  [facility] Cardiovascular and Pulmonary Nurse Navigator

## 2017-08-09 NOTE — Telephone Encounter (Signed)
Patient contacted regarding discharge from [facility] on 08/08/17.   Patient understands to follow up with provider ? On 08/15/17 at 0800 at [facility] with Ignacia Bayley, NP.  Patient understands discharge instructions? Yes   Patient understands medications and regiment? Yes  Patient understands to bring all medications to this visit? Yes

## 2017-08-15 ENCOUNTER — Ambulatory Visit: Payer: Medicare PPO | Admitting: Nurse Practitioner

## 2017-08-21 ENCOUNTER — Encounter: Payer: Self-pay | Admitting: Nurse Practitioner

## 2017-08-21 ENCOUNTER — Encounter: Payer: Medicare PPO | Attending: Cardiovascular Disease

## 2017-10-04 ENCOUNTER — Encounter: Payer: Self-pay | Admitting: Nurse Practitioner

## 2017-10-04 ENCOUNTER — Ambulatory Visit: Payer: Medicare PPO | Admitting: Nurse Practitioner

## 2017-12-12 ENCOUNTER — Emergency Department: Payer: Medicare PPO

## 2017-12-12 ENCOUNTER — Inpatient Hospital Stay
Admission: EM | Admit: 2017-12-12 | Discharge: 2017-12-13 | DRG: 315 | Disposition: A | Discharge status: Home / Self Care | Payer: Medicare PPO | Attending: Internal Medicine | Admitting: Internal Medicine

## 2017-12-12 ENCOUNTER — Encounter: Payer: Self-pay | Admitting: Emergency Medicine

## 2017-12-12 ENCOUNTER — Other Ambulatory Visit: Payer: Self-pay

## 2017-12-12 DIAGNOSIS — I252 Old myocardial infarction: Secondary | ICD-10-CM | POA: Diagnosis not present

## 2017-12-12 DIAGNOSIS — F17210 Nicotine dependence, cigarettes, uncomplicated: Secondary | ICD-10-CM | POA: Diagnosis present

## 2017-12-12 DIAGNOSIS — I5032 Chronic diastolic (congestive) heart failure: Secondary | ICD-10-CM | POA: Diagnosis present

## 2017-12-12 DIAGNOSIS — N1330 Unspecified hydronephrosis: Secondary | ICD-10-CM

## 2017-12-12 DIAGNOSIS — I130 Hypertensive heart and chronic kidney disease with heart failure and stage 1 through stage 4 chronic kidney disease, or unspecified chronic kidney disease: Secondary | ICD-10-CM | POA: Diagnosis present

## 2017-12-12 DIAGNOSIS — N183 Chronic kidney disease, stage 3 (moderate): Secondary | ICD-10-CM | POA: Diagnosis present

## 2017-12-12 DIAGNOSIS — I2510 Atherosclerotic heart disease of native coronary artery without angina pectoris: Secondary | ICD-10-CM | POA: Diagnosis present

## 2017-12-12 DIAGNOSIS — E860 Dehydration: Secondary | ICD-10-CM | POA: Diagnosis present

## 2017-12-12 DIAGNOSIS — M546 Pain in thoracic spine: Secondary | ICD-10-CM

## 2017-12-12 DIAGNOSIS — I959 Hypotension, unspecified: Principal | ICD-10-CM | POA: Diagnosis present

## 2017-12-12 DIAGNOSIS — J449 Chronic obstructive pulmonary disease, unspecified: Secondary | ICD-10-CM | POA: Diagnosis present

## 2017-12-12 DIAGNOSIS — N179 Acute kidney failure, unspecified: Secondary | ICD-10-CM | POA: Diagnosis present

## 2017-12-12 DIAGNOSIS — F329 Major depressive disorder, single episode, unspecified: Secondary | ICD-10-CM | POA: Diagnosis present

## 2017-12-12 DIAGNOSIS — Z66 Do not resuscitate: Secondary | ICD-10-CM | POA: Diagnosis present

## 2017-12-12 DIAGNOSIS — Z7902 Long term (current) use of antithrombotics/antiplatelets: Secondary | ICD-10-CM

## 2017-12-12 DIAGNOSIS — E785 Hyperlipidemia, unspecified: Secondary | ICD-10-CM | POA: Diagnosis present

## 2017-12-12 DIAGNOSIS — Z79899 Other long term (current) drug therapy: Secondary | ICD-10-CM

## 2017-12-12 DIAGNOSIS — Z955 Presence of coronary angioplasty implant and graft: Secondary | ICD-10-CM | POA: Diagnosis not present

## 2017-12-12 DIAGNOSIS — N19 Unspecified kidney failure: Secondary | ICD-10-CM | POA: Diagnosis present

## 2017-12-12 DIAGNOSIS — Z7982 Long term (current) use of aspirin: Secondary | ICD-10-CM | POA: Diagnosis not present

## 2017-12-12 DIAGNOSIS — I714 Abdominal aortic aneurysm, without rupture: Secondary | ICD-10-CM | POA: Diagnosis present

## 2017-12-12 HISTORY — DX: Malignant (primary) neoplasm, unspecified: C80.1

## 2017-12-12 LAB — CBC
HEMATOCRIT: 39.6 % (ref 35.0–47.0)
HEMOGLOBIN: 13.2 g/dL (ref 12.0–16.0)
MCH: 30.1 pg (ref 26.0–34.0)
MCHC: 33.4 g/dL (ref 32.0–36.0)
MCV: 90.4 fL (ref 80.0–100.0)
Platelets: 154 10*3/uL (ref 150–440)
RBC: 4.38 MIL/uL (ref 3.80–5.20)
RDW: 14.4 % (ref 11.5–14.5)
WBC: 6.1 10*3/uL (ref 3.6–11.0)

## 2017-12-12 LAB — URINALYSIS, COMPLETE (UACMP) WITH MICROSCOPIC
BILIRUBIN URINE: NEGATIVE
Glucose, UA: NEGATIVE mg/dL
KETONES UR: NEGATIVE mg/dL
Leukocytes, UA: NEGATIVE
Nitrite: NEGATIVE
PROTEIN: 30 mg/dL — AB
Specific Gravity, Urine: 1.015 (ref 1.005–1.030)
pH: 5 (ref 5.0–8.0)

## 2017-12-12 LAB — BASIC METABOLIC PANEL
ANION GAP: 13 (ref 5–15)
BUN: 41 mg/dL — ABNORMAL HIGH (ref 6–20)
CALCIUM: 9.3 mg/dL (ref 8.9–10.3)
CO2: 19 mmol/L — AB (ref 22–32)
Chloride: 105 mmol/L (ref 101–111)
Creatinine, Ser: 3.24 mg/dL — ABNORMAL HIGH (ref 0.44–1.00)
GFR calc Af Amer: 16 mL/min — ABNORMAL LOW (ref >60)
GFR calc non Af Amer: 14 mL/min — ABNORMAL LOW (ref >60)
GLUCOSE: 98 mg/dL (ref 65–99)
Potassium: 4.6 mmol/L (ref 3.5–5.1)
Sodium: 137 mmol/L (ref 135–145)

## 2017-12-12 LAB — TROPONIN I: Troponin I: <0.03 ng/mL (ref <0.03)

## 2017-12-12 MED ORDER — SODIUM CHLORIDE 0.9 % IV BOLUS (SEPSIS)
1000.0000 mL | Freq: Once | INTRAVENOUS | Status: AC
  Administered 2017-12-12: 1000 mL via INTRAVENOUS

## 2017-12-12 MED ORDER — SODIUM CHLORIDE 0.9 % IV BOLUS (SEPSIS)
500.0000 mL | Freq: Once | INTRAVENOUS | Status: AC
  Administered 2017-12-12: 500 mL via INTRAVENOUS

## 2017-12-12 MED ORDER — PREGABALIN 75 MG PO CAPS
150.0000 mg | ORAL_CAPSULE | Freq: Two times a day (BID) | ORAL | Status: DC
  Administered 2017-12-13: 150 mg via ORAL
  Filled 2017-12-12: qty 2

## 2017-12-12 MED ORDER — HEPARIN SODIUM (PORCINE) 5000 UNIT/ML IJ SOLN
5000.0000 [IU] | Freq: Three times a day (TID) | INTRAMUSCULAR | Status: DC
  Administered 2017-12-13: 5000 [IU] via SUBCUTANEOUS
  Filled 2017-12-12: qty 1

## 2017-12-12 MED ORDER — ACETAMINOPHEN 650 MG RE SUPP: 650.0000 mg | Freq: Four times a day (QID) | RECTAL | Status: DC | PRN

## 2017-12-12 MED ORDER — POLYETHYLENE GLYCOL 3350 17 G PO PACK: 17.0000 g | PACK | Freq: Every day | ORAL | Status: DC | PRN

## 2017-12-12 MED ORDER — IOPAMIDOL (ISOVUE-370) INJECTION 76%
75.0000 mL | Freq: Once | INTRAVENOUS | Status: AC | PRN
  Administered 2017-12-12: 75 mL via INTRAVENOUS

## 2017-12-12 MED ORDER — ONDANSETRON HCL 4 MG/2ML IJ SOLN: 4.0000 mg | Freq: Once | INTRAMUSCULAR | Status: DC

## 2017-12-12 MED ORDER — IPRATROPIUM-ALBUTEROL 0.5-2.5 (3) MG/3ML IN SOLN: 3.0000 mL | RESPIRATORY_TRACT | Status: DC | PRN

## 2017-12-12 MED ORDER — SODIUM CHLORIDE 0.9 % IV SOLN
INTRAVENOUS | Status: DC
  Administered 2017-12-12 – 2017-12-13 (×3): via INTRAVENOUS

## 2017-12-12 MED ORDER — TRAMADOL HCL 50 MG PO TABS: 50.0000 mg | ORAL_TABLET | Freq: Two times a day (BID) | ORAL | Status: DC | PRN

## 2017-12-12 MED ORDER — IPRATROPIUM-ALBUTEROL 20-100 MCG/ACT IN AERS: 1.0000 | INHALATION_SPRAY | RESPIRATORY_TRACT | Status: DC | PRN

## 2017-12-12 MED ORDER — SODIUM CHLORIDE 0.9 % IV BOLUS (SEPSIS): 1000.0000 mL | Freq: Once | INTRAVENOUS | Status: DC

## 2017-12-12 MED ORDER — ONDANSETRON HCL 4 MG/2ML IJ SOLN: 4.0000 mg | Freq: Four times a day (QID) | INTRAMUSCULAR | Status: DC | PRN

## 2017-12-12 MED ORDER — TICAGRELOR 90 MG PO TABS
90.0000 mg | ORAL_TABLET | Freq: Two times a day (BID) | ORAL | Status: DC
  Administered 2017-12-13: 90 mg via ORAL
  Filled 2017-12-12 (×3): qty 1

## 2017-12-12 MED ORDER — NITROGLYCERIN 0.4 MG SL SUBL: 0.4000 mg | SUBLINGUAL_TABLET | SUBLINGUAL | Status: DC | PRN

## 2017-12-12 MED ORDER — ACETAMINOPHEN 325 MG PO TABS: 650.0000 mg | ORAL_TABLET | Freq: Four times a day (QID) | ORAL | Status: DC | PRN

## 2017-12-12 MED ORDER — ONDANSETRON HCL 4 MG PO TABS: 4.0000 mg | ORAL_TABLET | Freq: Four times a day (QID) | ORAL | Status: DC | PRN

## 2017-12-12 MED ORDER — ASPIRIN 81 MG PO CHEW: 243.0000 mg | CHEWABLE_TABLET | Freq: Once | ORAL | Status: DC

## 2017-12-12 MED ORDER — ROSUVASTATIN CALCIUM 10 MG PO TABS: 40.0000 mg | ORAL_TABLET | Freq: Every day | ORAL | Status: DC

## 2017-12-12 MED ORDER — ASPIRIN EC 81 MG PO TBEC
81.0000 mg | DELAYED_RELEASE_TABLET | Freq: Every day | ORAL | Status: DC
  Administered 2017-12-13: 81 mg via ORAL
  Filled 2017-12-12: qty 1

## 2017-12-12 MED ORDER — FLUOXETINE HCL 20 MG PO CAPS
40.0000 mg | ORAL_CAPSULE | Freq: Every day | ORAL | Status: DC
  Administered 2017-12-13: 40 mg via ORAL
  Filled 2017-12-12: qty 2

## 2017-12-12 MED ORDER — LORATADINE 10 MG PO TABS
10.0000 mg | ORAL_TABLET | Freq: Every day | ORAL | Status: DC
  Administered 2017-12-13: 10 mg via ORAL
  Filled 2017-12-12: qty 1

## 2017-12-12 MED ORDER — MORPHINE SULFATE (PF) 4 MG/ML IV SOLN: 4.0000 mg | Freq: Once | INTRAVENOUS | Status: DC

## 2017-12-12 NOTE — ED Triage Notes (Signed)
Patient states she was at work today and then her back started to hurt.  Back pain is typical pain with heart attacks.  Patient took tylenol and patient boss noticed that color was pale and patient felt dizzy.

## 2017-12-12 NOTE — ED Provider Notes (Addendum)
[facility] Emergency Department Provider Note  ___________________________________________   First MD Initiated Contact with Patient 12/12/17 1349     (approximate)  I have reviewed the triage vital signs and the nursing notes.   HISTORY  Chief Complaint No chief complaint on file.   HPI Olivia Fields is a 67 y.o. 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  She says that the pain started at about 1230 at work and feels like a "pain."  She denies any worsening or alleviating factors.  Does not worsen with movement, deep breathing or activity.  Says that it is associated with shortness of breath as well as nausea.  Says that this presentation is very similar to past MIs where she has not had chest pain and only had back pain.   Patient also states that she has a history of a AAA.  Patient states the pain is an 8 out of 10 at this time.  Initial pressure from triage 70s.  However, up to the 100s.  Patient says that her normal pressure is about 983 systolic.  Past Medical History:  Diagnosis Date  . AAA (abdominal aortic aneurysm) ([place])    a. 08/2015 Abd U/S: 3.7cm AAA; b. 02/2017 CTA Abd/Pelvis: 4.4 cm infrarenal AAA.  Marland Kitchen Cancer ([place])    ovarian  . Cardiogenic shock ([place])   . Chronic diastolic CHF (congestive heart failure) ([place])    a. 02/2014 Echo: EF 50-55%, mod inf/inflat HK, diast dysfxn, mild Ao sclerosis w/o stenosis.  . CKD (chronic kidney disease), stage III ([place])   . COPD (chronic obstructive pulmonary disease) ([place])   . Coronary artery disease    a. Previous stenting of LAD, LCx and RCA at [facility];  b. 02/2014 Inf STEMI/PCI: LCX patent stent, RCA 70p, 123m/d (3.5x12, 2.75x18, 2.5x12 Xience Alpine DESs); c. 07/2017 PCI: :M nl, LAD 30p/m ISR, D1 50ost, LCX 20p/m ISR, OM2 60, OM3 40, RCA patent prox stent, 30/28m ISR (2.75x18 Resolute Onyx DES), patent distal stent, EF  45-50%, glob HK.  . H/O ovarian cancer 1990  . Hyperlipidemia   . Hypertension   . MI (myocardial infarction) ([place]) 2002, 2009, 2015    Patient Active Problem List   Diagnosis Date Noted  . Chest pain 08/04/2017  . Drainage from left ear 12/18/2014  . Back pain 12/18/2014  . Restless leg syndrome 12/18/2014  . Otalgia 06/02/2014  . Coronary artery disease   . Hyperlipidemia   . Hypertension     Past Surgical History:  Procedure Laterality Date  . CARDIAC CATHETERIZATION  2004   [facility];x1 stent  . CARDIAC CATHETERIZATION  2006   [facility];x2 stent  . CARDIAC CATHETERIZATION  02/2014   [facility];x3 stent  . CORONARY STENT INTERVENTION N/A 08/07/2017   Procedure: CORONARY STENT INTERVENTION;  Surgeon: Wellington Hampshire, MD;  Location: [place] [facility];  Service: Cardiovascular;  Laterality: N/A;  . LEFT HEART CATH AND CORONARY ANGIOGRAPHY N/A 08/07/2017   Procedure: LEFT HEART CATH AND CORONARY ANGIOGRAPHY;  Surgeon: Wellington Hampshire, MD;  Location: [place] [facility];  Service: Cardiovascular;  Laterality: N/A;  . TOTAL ABDOMINAL HYSTERECTOMY W/ BILATERAL SALPINGOOPHORECTOMY  1990   ovarian cancer    Prior to Admission medications   Medication Sig Start Date End Date Taking? Authorizing Provider  aspirin 81 MG tablet Take 81 mg by mouth daily.    [provider]  [facility]  MG TABS tablet TAKE 1 TABLET BY MOUTH EVERY 12 HOURS 04/30/15   Wellington Hampshire, MD  cetirizine (ZYRTEC) 10 MG tablet Take 10 mg by mouth daily.    [provider]  FLUoxetine (PROZAC) 40 MG capsule Take 1 capsule (40 mg total) by mouth daily. 12/18/14   Rubbie Battiest, RN  fluticasone (FLONASE) 50 MCG/ACT nasal spray Place 1 spray into both nostrils daily.    [provider]  furosemide (LASIX) 40 MG tablet Take 1 tablet (40 mg total) by mouth as needed for fluid or edema. 12/18/14   Rubbie Battiest, RN  Ipratropium-Albuterol (COMBIVENT) 20-100 MCG/ACT AERS respimat Inhale 1 puff into the  lungs as needed for wheezing. 12/18/14   Rubbie Battiest, RN  losartan (COZAAR) 25 MG tablet Take 1 tablet (25 mg total) by mouth daily. 12/18/14   Rubbie Battiest, RN  metoprolol succinate (TOPROL-XL) 25 MG 24 hr tablet Take 1 tablet (25 mg total) by mouth daily. 03/18/14   Wellington Hampshire, MD  nitroGLYCERIN (NITROSTAT) 0.4 MG SL tablet Place 0.4 mg under the tongue every 5 (five) minutes as needed for chest pain.    [provider]  pramipexole (MIRAPEX) 0.125 MG tablet Take 1 tablet (0.125 mg total) by mouth at bedtime. Take 2-3 hours before bedtime. After seven days can take 2 pills 2-3 hours before bedtime. Patient not taking: Reported on 08/04/2017 12/18/14   Rubbie Battiest, RN  pregabalin (LYRICA) 150 MG capsule Take 150 mg by mouth 2 (two) times daily.    [provider]  ranitidine (ZANTAC) 150 MG tablet Take 1 tablet (150 mg total) by mouth 2 (two) times daily. 12/18/14   Rubbie Battiest, RN  rosuvastatin (CRESTOR) 40 MG tablet Take 1 tablet (40 mg total) by mouth daily at 6 PM. 08/08/17   Henreitta Leber, MD  tiotropium (SPIRIVA HANDIHALER) 18 MCG inhalation capsule INHALE CONTENTS OF 1 CAPSULE ONCE DAILY USING HANDIHALER Patient not taking: Reported on 08/04/2017 12/18/14   Rubbie Battiest, RN  traMADol (ULTRAM) 50 MG tablet TAKE 1 TABLET BY MOUTH EVERY 8 HOURS AS NEEDED 05/08/15   Jackolyn Confer, MD    Allergies Patient has no known allergies.  Family History  Problem Relation Age of Onset  . Heart disease Mother   . Heart attack Mother   . Diabetes Mother   . Cancer Mother        Tonsils/Throat  . Heart disease Maternal Aunt   . Heart disease Maternal Aunt   . Heart disease Maternal Aunt   . Heart disease Maternal Aunt   . Heart disease Maternal Aunt   . Cancer Father        Lung  . Aneurysm Father     Social History Social History   Tobacco Use  . Smoking status: Current Every Day Smoker    Packs/day: 1.00    Years: 35.00    Pack years: 35.00    Types:  Cigarettes    Last attempt to quit: 03/11/2010    Years since quitting: 7.7  . Smokeless tobacco: Never Used  . Tobacco comment: currently smoking 7-8 cigarettes/day  Substance Use Topics  . Alcohol use: Yes    Comment: rare  . Drug use: No    Review of Systems  Constitutional: No fever/chills Eyes: No visual changes. ENT: No sore throat. Cardiovascular: Denies chest pain. Respiratory: As above  gastrointestinal: No abdominal pain.  No nausea, no vomiting.  No diarrhea.  No constipation. Genitourinary: Negative for dysuria. Musculoskeletal: As above Skin: Negative for rash. Neurological: Negative for headaches, focal weakness or numbness.   ____________________________________________   PHYSICAL EXAM:  VITAL SIGNS: ED Triage Vitals  Enc Vitals Group     BP 12/12/17 1333 (!) 70/45     Pulse Rate 12/12/17 1333 (!) 56     Resp 12/12/17 1333 20     Temp 12/12/17 1333 97.8 F (36.6 C)     Temp Source 12/12/17 1333 Oral     SpO2 12/12/17 1333 96 %     Weight 12/12/17 1333 160 lb (72.6 kg)     Height 12/12/17 1333 5\' 2"  (1.575 m)     Head Circumference --      Peak Flow --      Pain Score 12/12/17 1334 5     Pain Loc --      Pain Edu? --      Excl. in [place]? --     Constitutional: Alert and oriented. Well appearing and in no acute distress. Eyes: Conjunctivae are normal.  Head: Atraumatic. Nose: No congestion/rhinnorhea. Mouth/Throat: Mucous membranes are moist.  Neck: No stridor.   Cardiovascular: Bradycardic, regular rhythm. Grossly normal heart sounds.  Good peripheral circulation with equal and bilateral radial as well as dorsalis pedis pulses.   Respiratory: Normal respiratory effort.  No retractions. Lungs CTAB. Gastrointestinal: Soft and nontender. No distention. No CVA tenderness. Musculoskeletal: No lower extremity tenderness nor edema.  No joint effusions. Neurologic:  Normal speech and language. No gross focal neurologic deficits are appreciated. Skin:   Skin is warm, dry and intact. No rash noted. Psychiatric: Mood and affect are normal. Speech and behavior are normal.  ____________________________________________   LABS (all labs ordered are listed, but only abnormal results are displayed)  Labs Reviewed  BASIC METABOLIC PANEL - Abnormal; Notable for the following components:      Result Value   CO2 19 (*)    BUN 41 (*)    Creatinine, Ser 3.24 (*)    GFR calc non Af Amer 14 (*)    GFR calc Af Amer 16 (*)    All other components within normal limits  CBC  TROPONIN I  URINALYSIS, COMPLETE (UACMP) WITH MICROSCOPIC   ____________________________________________  EKG  ED ECG REPORT I, Doran Stabler, the attending physician, personally viewed and interpreted this ECG.   Date: 12/12/2017  EKG Time: 1358  Rate: 51  Rhythm: sinus bradycardia  Axis: Normal  Intervals:none  ST&T Change: No ST segment elevation or depression.  No abnormal T wave inversion.  ED ECG REPORT I, Doran Stabler, the attending physician, personally viewed and interpreted this ECG.   Date: 12/12/2017  EKG Time: 1325  Rate: 57  Rhythm: sinus bradycardia  Axis: Normal  Intervals:none  ST&T Change: No ST segment elevation or depression.  No abnormal T wave inversion.  ____________________________________________  RADIOLOGY  No acute disease on the chest x-ray  CT angiography of the chest abdomen and pelvis without evidence of dissection or abdominal aneurysm.  4.6 cm infrarenal abdominal aortic aneurysm.  This aneurysm was 3.8 cm on the CAT scan from 2015. ____________________________________________   PROCEDURES  Procedure(s) performed:   Procedures  Critical Care performed:   ____________________________________________   INITIAL IMPRESSION / ASSESSMENT AND PLAN / ED COURSE  Pertinent labs & imaging results that were available during my care of the patient were reviewed by me and considered in my medical decision making (see  chart for details).  Differential diagnosis includes, but is not limited to, ACS, aortic dissection, pulmonary embolism, cardiac tamponade, pneumothorax, pneumonia, pericarditis, myocarditis, GI-related causes including esophagitis/gastritis, and musculoskeletal chest wall pain.   As part of my medical decision making, I reviewed the following data within the electronic MEDICAL RECORD NUMBER Notes from prior ED visits  ----------------------------------------- 2:36 PM on 12/12/2017 -----------------------------------------  Patient will be transported emergently to CAT scan for angiography.  Tech, Vivien Rota, wanted me to discuss the case with radiology to obtain permission because of the patient's new onset renal failure.  I am concerned for a ruptured AAA or dissection because the patient's blood pressure dipped to the 70s again down from the 100s.  I discussed the risk of kidney failure and possible dialysis from the CAT scan with contrast.  The patient understands the risks and benefits and says that she would like to have a CAT scan at this time.  She says that she had a family member who was a vascular surgeon and she is aware of complications that can occur from aortic pathology.  She is understanding of the risks of contrast dye load especially in the context of her kidney failure.  We also discussed the benefits of diagnostics and she is understanding and would like to proceed with scanning with contrast.  She has capacity to make medical decisions with good insight into the risks and benefits of her medical condition and procedures.     ----------------------------------------- 3:36 PM on 12/12/2017 -----------------------------------------   Patient with improvement in her blood pressure to the 80s 60s.  Reassuring CAT scan.  I discussed case with Dr. Juleen China of nephrology who says to continue with IV fluids but does not recommend steroids or Mucomyst at this time.  Patient aware of need for  admission to the hospital.  She is understanding of the plan willing to comply.  We again discussed possible failure of contrast to closely monitor her kidney function.  UA pending.  Signed out to Dr. Marthann Schiller.    ____________________________________________   FINAL CLINICAL IMPRESSION(S) / ED DIAGNOSES  Acute renal failure.  Back pain.  Hypotension.     NEW MEDICATIONS STARTED DURING THIS VISIT:  New Prescriptions   No medications on file     Note:  This document was prepared using Dragon voice recognition software and may include unintentional dictation errors.     Orbie Pyo, MD 12/12/17 1538    Clearnce Hasten Randall An, MD 12/12/17 [phone]

## 2017-12-12 NOTE — ED Notes (Signed)
Pt reports that she was at work and that she started to have back pain.  Pt reports that when she had her heart attack in the past she was having back pain.  Pt states that she started to walk funny like she was drunk.  Pt states that the pain is a constant sharp pain that takes her breath away.

## 2017-12-12 NOTE — ED Notes (Signed)
Patient transported to 206 

## 2017-12-12 NOTE — ED Notes (Signed)
Wells Guiles RN, aware of bed ready

## 2017-12-12 NOTE — ED Notes (Signed)
Per Dr. Benjie Karvonen, if patient's BP stays at the level it is at this time, the patient will be able to go to regular Med/Surg.  Will continue to monitor closely.

## 2017-12-12 NOTE — ED Notes (Signed)
Bp taken a total of three times. R arm reading were 70/45, 65/46 and L arm reading was 66/43. Jane,RN aware of these readings.

## 2017-12-12 NOTE — H&P (Signed)
Olivia Fields NAME: Olivia Fields    MR#:  [id number]  DATE OF BIRTH:  May 28, 1951  DATE OF ADMISSION:  12/12/2017  PRIMARY CARE PHYSICIAN: Konrad Saha, MD   REQUESTING/REFERRING PHYSICIAN: dr Earl Lites  CHIEF COMPLAINT:   Back pain and dizziness HISTORY OF PRESENT ILLNESS:  Olivia Fields  is a 67 y.o. female with a known history of  AAA, chronic diastolic heart failure, COPD and CAD who presents from work with back pain. Her back pain was associated with SOB and nausea. She was concerned that it was a cardiac symptom. She is also complaining of dizziness and feelings of lightheadedness. She denies chest pain. She was found to have significant hypotension. Her systolic blood pressures were ranging in the 60 to 70s. She has received one and half liters of fluids and her blood pressure is in the 80s. She continues to endorse dizziness. She reports no urinary symptoms such as dysuria, frequency, urgency. She denies abdominal pain. She was found to have elevated creatinine on labs today but consented for CT CHEST to rule out a dissection. CT scan did not show evidence of dissection. ER physician spoke with nephrology consulted and regarding acute kidney injury and contrastgiven today for CT scan.  PAST MEDICAL HISTORY:   Past Medical History:  Diagnosis Date  . AAA (abdominal aortic aneurysm) ([place])    a. 08/2015 Abd U/S: 3.7cm AAA; b. 02/2017 CTA Abd/Pelvis: 4.4 cm infrarenal AAA.  Marland Kitchen Cancer ([place])    ovarian  . Cardiogenic shock ([place])   . Chronic diastolic CHF (congestive heart failure) ([place])    a. 02/2014 Echo: EF 50-55%, mod inf/inflat HK, diast dysfxn, mild Ao sclerosis w/o stenosis.  . CKD (chronic kidney disease), stage III ([place])   . COPD (chronic obstructive pulmonary disease) ([place])   . Coronary artery disease    a. Previous stenting of LAD, LCx and RCA at [facility];  b. 02/2014 Inf STEMI/PCI: LCX patent stent, RCA 70p, 130m/d  (3.5x12, 2.75x18, 2.5x12 Xience Alpine DESs); c. 07/2017 PCI: :M nl, LAD 30p/m ISR, D1 50ost, LCX 20p/m ISR, OM2 60, OM3 40, RCA patent prox stent, 30/66m ISR (2.75x18 Resolute Onyx DES), patent distal stent, EF 45-50%, glob HK.  . H/O ovarian cancer 1990  . Hyperlipidemia   . Hypertension   . MI (myocardial infarction) ([place]) 2002, 2009, 2015    PAST SURGICAL HISTORY:   Past Surgical History:  Procedure Laterality Date  . CARDIAC CATHETERIZATION  2004   [facility];x1 stent  . CARDIAC CATHETERIZATION  2006   [facility];x2 stent  . CARDIAC CATHETERIZATION  02/2014   [facility];x3 stent  . CORONARY STENT INTERVENTION N/A 08/07/2017   Procedure: CORONARY STENT INTERVENTION;  Surgeon: Wellington Hampshire, MD;  Location: [place];  Service: Cardiovascular;  Laterality: N/A;  . LEFT HEART CATH AND CORONARY ANGIOGRAPHY N/A 08/07/2017   Procedure: LEFT HEART CATH AND CORONARY ANGIOGRAPHY;  Surgeon: Wellington Hampshire, MD;  Location: [place] [facility];  Service: Cardiovascular;  Laterality: N/A;  . TOTAL ABDOMINAL HYSTERECTOMY W/ BILATERAL SALPINGOOPHORECTOMY  1990   ovarian cancer    SOCIAL HISTORY:   Social History   Tobacco Use  . Smoking status: Current Every Day Smoker    Packs/day: 1.00    Years: 35.00    Pack years: 35.00    Types: Cigarettes    Last attempt to quit: 03/11/2010    Years since quitting: 7.7  . Smokeless tobacco: Never Used  .  Tobacco comment: currently smoking 7-8 cigarettes/day  Substance Use Topics  . Alcohol use: Yes    Comment: rare    FAMILY HISTORY:   Family History  Problem Relation Age of Onset  . Heart disease Mother   . Heart attack Mother   . Diabetes Mother   . Cancer Mother        Tonsils/Throat  . Heart disease Maternal Aunt   . Heart disease Maternal Aunt   . Heart disease Maternal Aunt   . Heart disease Maternal Aunt   . Heart disease Maternal Aunt   . Cancer Father        Lung  . Aneurysm Father     DRUG ALLERGIES:  No Known  Allergies  REVIEW OF SYSTEMS:   Review of Systems  Constitutional: Negative.  Negative for chills, fever and malaise/fatigue.  HENT: Negative.  Negative for ear discharge, ear pain, hearing loss, nosebleeds and sore throat.   Eyes: Negative.  Negative for blurred vision and pain.  Respiratory: Positive for shortness of breath. Negative for cough, hemoptysis and wheezing.   Cardiovascular: Negative.  Negative for chest pain, palpitations and leg swelling.  Gastrointestinal: Positive for nausea. Negative for abdominal pain, blood in stool, diarrhea and vomiting.  Genitourinary: Negative.  Negative for dysuria.  Musculoskeletal: Negative.  Negative for back pain.  Skin: Negative.   Neurological: Positive for dizziness. Negative for tingling, tremors, speech change, focal weakness, seizures and headaches.  Endo/Heme/Allergies: Negative.  Does not bruise/bleed easily.  Psychiatric/Behavioral: Negative.  Negative for depression, hallucinations and suicidal ideas.    MEDICATIONS AT HOME:   Prior to Admission medications   Medication Sig Start Date End Date Taking? Authorizing Provider  aspirin 81 MG tablet Take 81 mg by mouth daily.    [provider]  BRILINTA 90 MG TABS tablet TAKE 1 TABLET BY MOUTH EVERY 12 HOURS 04/30/15   Wellington Hampshire, MD  cetirizine (ZYRTEC) 10 MG tablet Take 10 mg by mouth daily.    [provider]  FLUoxetine (PROZAC) 40 MG capsule Take 1 capsule (40 mg total) by mouth daily. 12/18/14   Rubbie Battiest, RN  fluticasone (FLONASE) 50 MCG/ACT nasal spray Place 1 spray into both nostrils daily.    [provider]  furosemide (LASIX) 40 MG tablet Take 1 tablet (40 mg total) by mouth as needed for fluid or edema. 12/18/14   Rubbie Battiest, RN  Ipratropium-Albuterol (COMBIVENT) 20-100 MCG/ACT AERS respimat Inhale 1 puff into the lungs as needed for wheezing. 12/18/14   Rubbie Battiest, RN  losartan (COZAAR) 25 MG tablet Take 1 tablet (25 mg total) by  mouth daily. 12/18/14   Rubbie Battiest, RN  metoprolol succinate (TOPROL-XL) 25 MG 24 hr tablet Take 1 tablet (25 mg total) by mouth daily. 03/18/14   Wellington Hampshire, MD  nitroGLYCERIN (NITROSTAT) 0.4 MG SL tablet Place 0.4 mg under the tongue every 5 (five) minutes as needed for chest pain.    [provider]  pramipexole (MIRAPEX) 0.125 MG tablet Take 1 tablet (0.125 mg total) by mouth at bedtime. Take 2-3 hours before bedtime. After seven days can take 2 pills 2-3 hours before bedtime. Patient not taking: Reported on 08/04/2017 12/18/14   Rubbie Battiest, RN  pregabalin (LYRICA) 150 MG capsule Take 150 mg by mouth 2 (two) times daily.    [provider]  ranitidine (ZANTAC) 150 MG tablet Take 1 tablet (150 mg total) by mouth 2 (two) times daily. 12/18/14  Rubbie Battiest, RN  rosuvastatin (CRESTOR) 40 MG tablet Take 1 tablet (40 mg total) by mouth daily at 6 PM. 08/08/17   Henreitta Leber, MD  tiotropium (SPIRIVA HANDIHALER) 18 MCG inhalation capsule INHALE CONTENTS OF 1 CAPSULE ONCE DAILY USING HANDIHALER Patient not taking: Reported on 08/04/2017 12/18/14   Rubbie Battiest, RN  traMADol (ULTRAM) 50 MG tablet TAKE 1 TABLET BY MOUTH EVERY 8 HOURS AS NEEDED 05/08/15   Jackolyn Confer, MD      VITAL SIGNS:  Blood pressure (!) 88/61, pulse (!) 56, temperature 97.8 F (36.6 C), temperature source Oral, resp. rate 12, height 5\' 2"  (1.575 m), weight 72.6 kg (160 lb), SpO2 100 %.  PHYSICAL EXAMINATION:   Physical Exam  Constitutional: She is oriented to person, place, and time and well-developed, well-nourished, and in no distress. No distress.  HENT:  Head: Normocephalic.  Eyes: No scleral icterus.  Neck: Normal range of motion. Neck supple. No JVD present. No tracheal deviation present.  Cardiovascular: Normal rate, regular rhythm and normal heart sounds. Exam reveals no gallop and no friction rub.  No murmur heard. Pulmonary/Chest: Effort normal and breath sounds normal. No  respiratory distress. She has no wheezes. She has no rales. She exhibits no tenderness.  Abdominal: Soft. Bowel sounds are normal. She exhibits no distension and no mass. There is no tenderness. There is no rebound and no guarding.  Musculoskeletal: Normal range of motion. She exhibits no edema.  Neurological: She is alert and oriented to person, place, and time.  Skin: Skin is warm. No rash noted. No erythema.  Psychiatric: Affect and judgment normal.      LABORATORY PANEL:   CBC Recent Labs  Lab 12/12/17 1335  WBC 6.1  HGB 13.2  HCT 39.6  PLT 154   ------------------------------------------------------------------------------------------------------------------  Chemistries  Recent Labs  Lab 12/12/17 1335  NA 137  K 4.6  CL 105  CO2 19*  GLUCOSE 98  BUN 41*  CREATININE 3.24*  CALCIUM 9.3   ------------------------------------------------------------------------------------------------------------------  Cardiac Enzymes Recent Labs  Lab 12/12/17 1335  TROPONINI <0.03   ------------------------------------------------------------------------------------------------------------------  RADIOLOGY:  Dg Chest Portable 1 View  Result Date: 12/12/2017 CLINICAL DATA:  Back and chest pain EXAM: PORTABLE CHEST 1 VIEW COMPARISON:  08/04/2017 FINDINGS: Normal heart size and mediastinal contours. Right coronary atherosclerotic calcification. No acute infiltrate or edema. No effusion or pneumothorax. No acute osseous findings. IMPRESSION: No evidence of active disease. Electronically Signed   By: Monte Fantasia M.D.   On: 12/12/2017 14:10   Ct Angio Chest/abd/pel For Dissection W And/or Wo Contrast  Result Date: 12/12/2017 CLINICAL DATA:  Chest and back pain. EXAM: CT ANGIOGRAPHY CHEST, ABDOMEN AND PELVIS TECHNIQUE: Multidetector CT imaging through the chest, abdomen and pelvis was performed using the standard protocol during bolus administration of intravenous contrast.  Multiplanar reconstructed images and MIPs were obtained and reviewed to evaluate the vascular anatomy. CONTRAST:  29mL ISOVUE-370 IOPAMIDOL (ISOVUE-370) INJECTION 76% COMPARISON:  CT scan of March 02, 2017. FINDINGS: CTA CHEST FINDINGS Cardiovascular: Atherosclerosis of thoracic aorta is noted without aneurysm or dissection. Great vessels are widely patent. Normal cardiac size. No pericardial effusion is noted. Coronary artery calcifications are noted. Mediastinum/Nodes: 1.6 cm left thyroid nodule is noted. Esophagus is unremarkable. No significant adenopathy is noted. Lungs/Pleura: Lungs are clear. No pleural effusion or pneumothorax. Musculoskeletal: No chest wall abnormality. No acute or significant osseous findings. Review of the MIP images confirms the above findings. CTA ABDOMEN AND PELVIS FINDINGS VASCULAR Aorta:  4.6 cm infrarenal abdominal aortic aneurysm is noted. No dissection is noted. Mural thrombus is noted. Celiac: Patent without evidence of aneurysm, dissection, vasculitis or significant stenosis. SMA: Patent without evidence of aneurysm, dissection, vasculitis or significant stenosis. Renals: Both renal arteries are patent without evidence of aneurysm, dissection, vasculitis, fibromuscular dysplasia or significant stenosis. IMA: Appears to fill through retrograde flow. Inflow: Atherosclerosis of iliac arteries is noted without significant stenosis. Veins: No obvious venous abnormality within the limitations of this arterial phase study. Review of the MIP images confirms the above findings. NON-VASCULAR Hepatobiliary: No focal liver abnormality is seen. No gallstones, gallbladder wall thickening, or biliary dilatation. Pancreas: Unremarkable. No pancreatic ductal dilatation or surrounding inflammatory changes. Spleen: Normal in size without focal abnormality. Adrenals/Urinary Tract: Adrenal glands are unremarkable. Kidneys are normal, without renal calculi, focal lesion, or hydronephrosis. Bladder is  unremarkable. Stomach/Bowel: Stomach is within normal limits. Appendix appears normal. No evidence of bowel wall thickening, distention, or inflammatory changes. Sigmoid diverticulosis without inflammation. Lymphatic: No significant adenopathy is noted. Reproductive: Status post hysterectomy. No adnexal masses. Other: No abdominal wall hernia or abnormality. No abdominopelvic ascites. Musculoskeletal: No acute or significant osseous findings. Review of the MIP images confirms the above findings. IMPRESSION: There is no evidence of dissection seen in the thoracic or abdominal aorta. Aortic atherosclerosis. 4.6 cm infrarenal abdominal aortic aneurysm is noted. Recommend followup by abdomen and pelvis CTA in 6 months, and vascular surgery referral/consultation if not already obtained. This recommendation follows ACR consensus guidelines: White Paper of the ACR Incidental Findings Committee II on Vascular Findings. J Am Coll Radiol 2013; 10:789-794. Coronary artery calcifications are noted suggesting coronary artery disease. 1.6 cm left thyroid nodule is noted. Thyroid ultrasound is recommended for further evaluation. Sigmoid diverticulosis is noted without inflammation. No other abnormality seen in the abdomen or pelvis. Electronically Signed   By: Marijo Conception, M.D.   On: 12/12/2017 15:16    EKG:  Sinus bradycardia with Q waves in the anterior leads no ST elevation or depression  IMPRESSION AND PLAN:   67 year old female with a history of CAD/stents, chronic diastolic heart failure, COPDand AAA who presents with back pain  1. Acute kidney injuryAnd chronic kidney disease stage III: Hold all nephrotoxic medications Since she received contrast for CT scan nephrology has been contacted by ED physician. Renal ultrasound ordered Monitor intake and output Further recommendations after nephrology consultation  2. Severe Hypotension of unclear etiology Check a.m. Cortisol level Hold blood pressure  medications Continue aggressive hydration with consideration of pressors if needed Case discussed with intensivist   3. Back pain: Patient will have telemetry monitoring as well as troponins.  4.4.6 cm infrarenal abdominal aortic aneurysm is noted. Recommend followup by abdomen and pelvis CTA in 6 months, and vascular surgery referral/consultation if not already obtained. This recommendation follows ACR consensus guidelines.   5. History of CAD: Holding metoprolol due to hypotension 6. Chronic diastolic heart failure without signs of exacerbation  7. COPD without signs of exacerbation: Continue inhalers 8. Depression: Continue fluoxetine   All the records are reviewed and case discussed with ED provider. Management plans discussed with the patient and she is in agreement  CODE STATUS: DO NOT RESUSCITATE  CRITICAL CARE due to hypotension and acute renal failure TOTAL TIME TAKING CARE OF THIS PATIENT: 45 minutes.    Rico Massar M.D on 12/12/2017 at 3:44 PM  Between 7am to 6pm - Pager - [phone]  After 6pm go to [email or web address] - [place]  [place]  [phone]  CC: Primary care physician; Konrad Saha, MD

## 2017-12-12 NOTE — ED Notes (Signed)
Pt refusing foley catheter at this time.  Pt up to bathroom and had 300cc of urine output at this time.  Will continue to monitor.

## 2017-12-12 NOTE — ED Notes (Signed)
In with EDP.  Pt's kidney function poor per CT tech and EDP in discussing risk vs benefit of having scan.  Pt okay with having scan at this time.  EDP confirmed with radiologist and CT tech notified to come get pt per EDP and pt okay.  Pt is A&Ox4, in NAD, BP remain labile at this time.

## 2017-12-13 LAB — CBC
HEMATOCRIT: 37.9 % (ref 35.0–47.0)
Hemoglobin: 12.7 g/dL (ref 12.0–16.0)
MCH: 30.5 pg (ref 26.0–34.0)
MCHC: 33.4 g/dL (ref 32.0–36.0)
MCV: 91.4 fL (ref 80.0–100.0)
Platelets: 104 10*3/uL — ABNORMAL LOW (ref 150–440)
RBC: 4.15 MIL/uL (ref 3.80–5.20)
RDW: 14.7 % — AB (ref 11.5–14.5)
WBC: 4.5 10*3/uL (ref 3.6–11.0)

## 2017-12-13 LAB — BASIC METABOLIC PANEL
ANION GAP: 7 (ref 5–15)
ANION GAP: 10 (ref 5–15)
BUN: 36 mg/dL — AB (ref 6–20)
BUN: 33 mg/dL — ABNORMAL HIGH (ref 6–20)
CALCIUM: 8.5 mg/dL — AB (ref 8.9–10.3)
CO2: 20 mmol/L — ABNORMAL LOW (ref 22–32)
CO2: 19 mmol/L — ABNORMAL LOW (ref 22–32)
Calcium: 8.1 mg/dL — ABNORMAL LOW (ref 8.9–10.3)
Chloride: 112 mmol/L — ABNORMAL HIGH (ref 101–111)
Chloride: 113 mmol/L — ABNORMAL HIGH (ref 101–111)
Creatinine, Ser: 2.41 mg/dL — ABNORMAL HIGH (ref 0.44–1.00)
Creatinine, Ser: 2.02 mg/dL — ABNORMAL HIGH (ref 0.44–1.00)
GFR, EST AFRICAN AMERICAN: 23 mL/min — AB (ref >60)
GFR, EST AFRICAN AMERICAN: 29 mL/min — AB (ref >60)
GFR, EST NON AFRICAN AMERICAN: 20 mL/min — AB (ref >60)
GFR, EST NON AFRICAN AMERICAN: 25 mL/min — AB (ref >60)
Glucose, Bld: 96 mg/dL (ref 65–99)
Glucose, Bld: 87 mg/dL (ref 65–99)
POTASSIUM: 4.1 mmol/L (ref 3.5–5.1)
POTASSIUM: 4.4 mmol/L (ref 3.5–5.1)
SODIUM: 139 mmol/L (ref 135–145)
Sodium: 142 mmol/L (ref 135–145)

## 2017-12-13 LAB — TROPONIN I
Troponin I: <0.03 ng/mL (ref <0.03)
Troponin I: <0.03 ng/mL (ref <0.03)
Troponin I: <0.03 ng/mL (ref <0.03)

## 2017-12-13 LAB — CORTISOL: Cortisol, Plasma: 8.8 ug/dL

## 2017-12-13 MED ORDER — METOPROLOL SUCCINATE ER 25 MG PO TB24: 25.0000 mg | ORAL_TABLET | Freq: Every day | ORAL | 6 refills | Status: AC

## 2017-12-13 NOTE — Consult Note (Signed)
[facility]  CONSULT NOTE    Date: 12/13/2017                  Patient Name:  Olivia Fields  MRN: [id number]  DOB: [birth year]  Age / Sex: 67 y.o., female         PCP: Konrad Saha, MD                 Service Requesting Consult: Dr. Benjie Karvonen                 Reason for Consult: Acute renal failure            History of Present Illness: Olivia Fields is a 67 y.o. white female with AAA, diastolic congestive heart failure, COPD, coronary artery disease, hypertension, history of ovarian cancer treated with cisplatin, hyperlipidemia, tobacco abuse, depression , who was admitted to [facility] on 12/12/2017 for Hypotension, unspecified hypotension type [I95.9] Acute renal failure, unspecified acute renal failure type ([place]) [N17.9] Acute midline thoracic back pain [M54.6] Renal failure [N19]   Patient was found to have a creatinine fo 3.24 and then had IV contrast exposure for CTA looking for abdominal aneurysm rupture. Give IV fluids overnight. Creatinine now 2.41   Medications: Outpatient medications: Medications Prior to Admission  Medication Sig Dispense Refill Last Dose  . aspirin 81 MG tablet Take 81 mg by mouth daily.   12/11/2017 at Unknown time  . BRILINTA 90 MG TABS tablet TAKE 1 TABLET BY MOUTH EVERY 12 HOURS 60 tablet 0 12/12/2017 at Unknown time  . cetirizine (ZYRTEC) 10 MG tablet Take 10 mg by mouth daily.   12/11/2017 at Unknown time  . FLUoxetine (PROZAC) 40 MG capsule Take 1 capsule (40 mg total) by mouth daily. 90 capsule 3 12/12/2017 at Unknown time  . fluticasone (FLONASE) 50 MCG/ACT nasal spray Place 1 spray into both nostrils daily.   12/11/2017 at Unknown time  . losartan (COZAAR) 25 MG tablet Take 1 tablet (25 mg total) by mouth daily. 90 tablet 3 12/12/2017 at Unknown time  . metoprolol succinate (TOPROL-XL) 25 MG 24 hr tablet Take 1 tablet (25 mg total) by mouth daily. (Patient taking differently: Take 25 mg by mouth 3 (three) times daily.  ) 30 tablet 6 12/11/2017 at Unknown time  . pregabalin (LYRICA) 150 MG capsule Take 150 mg by mouth 2 (two) times daily.   12/11/2017 at Unknown time  . ranitidine (ZANTAC) 150 MG tablet Take 1 tablet (150 mg total) by mouth 2 (two) times daily. 90 tablet 3 12/11/2017 at Unknown time  . rosuvastatin (CRESTOR) 40 MG tablet Take 1 tablet (40 mg total) by mouth daily at 6 PM. 30 tablet 1 12/11/2017 at Unknown time  . furosemide (LASIX) 40 MG tablet Take 1 tablet (40 mg total) by mouth as needed for fluid or edema. 90 tablet 3 prn at prn  . Ipratropium-Albuterol (COMBIVENT) 20-100 MCG/ACT AERS respimat Inhale 1 puff into the lungs as needed for wheezing. (Patient not taking: Reported on 12/12/2017) 1 Inhaler 3 Not Taking at Unknown time  . nitroGLYCERIN (NITROSTAT) 0.4 MG SL tablet Place 0.4 mg under the tongue every 5 (five) minutes as needed for chest pain.   prn at prn  . pramipexole (MIRAPEX) 0.125 MG tablet Take 1 tablet (0.125 mg total) by mouth at bedtime. Take 2-3 hours before bedtime. After seven days can take 2 pills 2-3 hours before bedtime. (Patient not taking: Reported on 08/04/2017) 90 tablet 0  Not Taking at Unknown time  . tiotropium (SPIRIVA HANDIHALER) 18 MCG inhalation capsule INHALE CONTENTS OF 1 CAPSULE ONCE DAILY USING HANDIHALER (Patient not taking: Reported on 08/04/2017) 90 capsule 2 Not Taking at Unknown time  . traMADol (ULTRAM) 50 MG tablet TAKE 1 TABLET BY MOUTH EVERY 8 HOURS AS NEEDED 60 tablet 0 prn at prn    Current medications: Current Facility-Administered Medications  Medication Dose Route Frequency Provider Last Rate Last Dose  . 0.9 %  sodium chloride infusion   Intravenous Continuous Hillary Bow, MD 100 mL/hr at 12/13/17 0914    . acetaminophen (TYLENOL) tablet 650 mg  650 mg Oral Q6H PRN Bettey Costa, MD       Or  . acetaminophen (TYLENOL) suppository 650 mg  650 mg Rectal Q6H PRN Mody, Sital, MD      . aspirin chewable tablet 243 mg  243 mg Oral Once Orbie Pyo, MD   Stopped at 12/12/17 1415  . aspirin EC tablet 81 mg  81 mg Oral Daily Mody, Sital, MD   81 mg at 12/13/17 1030  . FLUoxetine (PROZAC) capsule 40 mg  40 mg Oral Daily Mody, Sital, MD   40 mg at 12/13/17 1030  . heparin injection 5,000 Units  5,000 Units Subcutaneous Q8H Bettey Costa, MD   5,000 Units at 12/13/17 0525  . ipratropium-albuterol (DUONEB) 0.5-2.5 (3) MG/3ML nebulizer solution 3 mL  3 mL Nebulization PRN Mody, Sital, MD      . loratadine (CLARITIN) tablet 10 mg  10 mg Oral Daily Mody, Sital, MD   10 mg at 12/13/17 1030  . morphine 4 MG/ML injection 4 mg  4 mg Intravenous Once Orbie Pyo, MD   Stopped at 12/12/17 1415  . nitroGLYCERIN (NITROSTAT) SL tablet 0.4 mg  0.4 mg Sublingual Q5 min PRN Bettey Costa, MD      . ondansetron (ZOFRAN) injection 4 mg  4 mg Intravenous Once Orbie Pyo, MD      . ondansetron [facility]) tablet 4 mg  4 mg Oral Q6H PRN Bettey Costa, MD       Or  . ondansetron (ZOFRAN) injection 4 mg  4 mg Intravenous Q6H PRN Mody, Sital, MD      . polyethylene glycol (MIRALAX / GLYCOLAX) packet 17 g  17 g Oral Daily PRN Mody, Sital, MD      . pregabalin (LYRICA) capsule 150 mg  150 mg Oral BID Bettey Costa, MD   150 mg at 12/13/17 1030  . rosuvastatin (CRESTOR) tablet 40 mg  40 mg Oral q1800 Mody, Sital, MD      . ticagrelor (BRILINTA) tablet 90 mg  90 mg Oral Q12H Mody, Sital, MD   90 mg at 12/13/17 1030  . traMADol (ULTRAM) tablet 50 mg  50 mg Oral Q12H PRN Bettey Costa, MD          Allergies: No Known Allergies    Past Medical History: Past Medical History:  Diagnosis Date  . AAA (abdominal aortic aneurysm) ([place])    a. 08/2015 Abd U/S: 3.7cm AAA; b. 02/2017 CTA Abd/Pelvis: 4.4 cm infrarenal AAA.  Marland Kitchen Cancer ([place])    ovarian  . Cardiogenic shock ([place])   . Chronic diastolic CHF (congestive heart failure) ([place])    a. 02/2014 Echo: EF 50-55%, mod inf/inflat HK, diast dysfxn, mild Ao sclerosis w/o stenosis.  . CKD (chronic kidney  disease), stage III ([place])   . COPD (chronic obstructive pulmonary disease) ([place])   . Coronary artery  disease    a. Previous stenting of LAD, LCx and RCA at [facility];  b. 02/2014 Inf STEMI/PCI: LCX patent stent, RCA 70p, 157m/d (3.5x12, 2.75x18, 2.5x12 Xience Alpine DESs); c. 07/2017 PCI: :M nl, LAD 30p/m ISR, D1 50ost, LCX 20p/m ISR, OM2 60, OM3 40, RCA patent prox stent, 30/45m ISR (2.75x18 Resolute Onyx DES), patent distal stent, EF 45-50%, glob HK.  . H/O ovarian cancer 1990  . Hyperlipidemia   . Hypertension   . MI (myocardial infarction) ([place]) 2002, 2009, 2015     Past Surgical History: Past Surgical History:  Procedure Laterality Date  . CARDIAC CATHETERIZATION  2004   [facility];x1 stent  . CARDIAC CATHETERIZATION  2006   [facility];x2 stent  . CARDIAC CATHETERIZATION  02/2014   [facility];x3 stent  . CORONARY STENT INTERVENTION N/A 08/07/2017   Procedure: CORONARY STENT INTERVENTION;  Surgeon: Wellington Hampshire, MD;  Location: [facility];  Service: Cardiovascular;  Laterality: N/A;  . LEFT HEART CATH AND CORONARY ANGIOGRAPHY N/A 08/07/2017   Procedure: LEFT HEART CATH AND CORONARY ANGIOGRAPHY;  Surgeon: Wellington Hampshire, MD;  Location: [facility];  Service: Cardiovascular;  Laterality: N/A;  . TOTAL ABDOMINAL HYSTERECTOMY W/ BILATERAL SALPINGOOPHORECTOMY  1990   ovarian cancer     Family History: Family History  Problem Relation Age of Onset  . Heart disease Mother   . Heart attack Mother   . Diabetes Mother   . Cancer Mother        Tonsils/Throat  . Heart disease Maternal Aunt   . Heart disease Maternal Aunt   . Heart disease Maternal Aunt   . Heart disease Maternal Aunt   . Heart disease Maternal Aunt   . Cancer Father        Lung  . Aneurysm Father      Social History: Social History   Socioeconomic History  . Marital status: Single    Spouse name: Not on file  . Number of children: 2  . Years of education: 53  . Highest education level: Not on file  Social  Needs  . Financial resource strain: Not on file  . Food insecurity - worry: Not on file  . Food insecurity - inability: Not on file  . Transportation needs - medical: Not on file  . Transportation needs - non-medical: Not on file  Occupational History  . Occupation: Press photographer    Comment: retired  . 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  Tobacco Use  . Smoking status: Current Every Day Smoker    Packs/day: 1.00    Years: 35.00    Pack years: 35.00    Types: Cigarettes    Last attempt to quit: 03/11/2010    Years since quitting: 7.7  . Smokeless tobacco: Never Used  . Tobacco comment: currently smoking 7-8 cigarettes/day  Substance and Sexual Activity  . Alcohol use: Yes    Comment: rare  . Drug use: No  . Sexual activity: Not on file  Other Topics Concern  . Not on file  Social History Narrative   Born and raised in [place], [place]. Currrently resides in a house with daughter Anderson [place]) in [place]. Another daughter in [place]). Has 5 granddaughters Vernie Shanks, [place], [place], [place]).      Review of Systems: Review of Systems  Constitutional: Positive for chills, diaphoresis and malaise/fatigue. Negative for fever and weight loss.  HENT: Negative.  Negative for congestion, ear discharge, ear pain,  hearing loss, nosebleeds, sinus pain, sore throat and tinnitus.   Eyes: Negative.  Negative for blurred vision, double vision, photophobia, pain, discharge and redness.  Respiratory: Negative.  Negative for cough, hemoptysis, sputum production, shortness of breath, wheezing and stridor.   Cardiovascular: Negative.  Negative for chest pain, palpitations, orthopnea, claudication, leg swelling and PND.  Gastrointestinal: Negative.  Negative for abdominal pain, blood in stool, constipation, diarrhea, heartburn, melena, nausea and vomiting.  Genitourinary: Negative.  Negative for dysuria, flank pain, frequency, hematuria and urgency.   Musculoskeletal: Positive for back pain. Negative for falls, joint pain, myalgias and neck pain.  Skin: Negative.  Negative for itching and rash.  Neurological: Positive for weakness. Negative for dizziness, tingling, tremors, sensory change, speech change, focal weakness, seizures, loss of consciousness and headaches.  Endo/Heme/Allergies: Negative.  Negative for environmental allergies and polydipsia. Does not bruise/bleed easily.  Psychiatric/Behavioral: Negative.  Negative for depression, hallucinations, memory loss, substance abuse and suicidal ideas. The patient is not nervous/anxious and does not have insomnia.     Vital Signs: Blood pressure 115/65, pulse (!) 52, temperature 98.2 F (36.8 C), temperature source Oral, resp. rate 14, height 5\' 2"  (1.575 m), weight 72.6 kg (160 lb), SpO2 100 %.  Weight trends: Filed Weights   12/12/17 1333  Weight: 72.6 kg (160 lb)    Physical Exam: General: NAD, laying in bed  Head: Normocephalic, atraumatic. Moist oral mucosal membranes  Eyes: Anicteric, PERRL  Neck: Supple, trachea midline  Lungs:  Clear to auscultation  Heart: Regular rate and rhythm  Abdomen:  Soft, nontender,   Extremities: no peripheral edema.  Neurologic: Nonfocal, moving all four extremities  Skin: No lesions        Lab results: Basic Metabolic Panel: Recent Labs  Lab 12/12/17 1335 12/13/17 0443 12/13/17 1449  NA 137 139 142  K 4.6 4.1 4.4  CL 105 112* 113*  CO2 19* 20* 19*  GLUCOSE 98 96 87  BUN 41* 36* 33*  CREATININE 3.24* 2.41* 2.02*  CALCIUM 9.3 8.1* 8.5*    Liver Function Tests: No results for input(s): AST, ALT, ALKPHOS, BILITOT, PROT, ALBUMIN in the last 168 hours. No results for input(s): LIPASE, AMYLASE in the last 168 hours. No results for input(s): AMMONIA in the last 168 hours.  CBC: Recent Labs  Lab 12/12/17 1335 12/13/17 0443  WBC 6.1 4.5  HGB 13.2 12.7  HCT 39.6 37.9  MCV 90.4 91.4  PLT 154 104*    Cardiac  Enzymes: Recent Labs  Lab 12/12/17 1335 12/12/17 2257 12/13/17 0443 12/13/17 1043  TROPONINI <0.03 <0.03 <0.03 <0.03    BNP: Invalid input(s): POCBNP  CBG: No results for input(s): GLUCAP in the last 168 hours.  Microbiology: Results for orders placed or performed during the hospital encounter of 12/12/17  Blood culture (routine x 2)     Status: None (Preliminary result)   Collection Time: 12/12/17  5:03 PM  Result Value Ref Range Status   Specimen Description BLOOD RIGHT ARM  Final   Special Requests   Final    BOTTLES DRAWN AEROBIC AND ANAEROBIC Blood Culture adequate volume   Culture   Final    NO GROWTH < 24 HOURS Performed at [facility], [address] [place], [place] [address]    Report Status PENDING  Incomplete  Blood culture (routine x 2)     Status: None (Preliminary result)   Collection Time: 12/12/17  5:03 PM  Result Value Ref Range Status   Specimen Description BLOOD  RIGHT WRIST  Final   Special Requests   Final    BOTTLES DRAWN AEROBIC AND ANAEROBIC Blood Culture adequate volume   Culture   Final    NO GROWTH < 24 HOURS Performed at [facility], [address] [place], [place] [address]    Report Status PENDING  Incomplete    Coagulation Studies: No results for input(s): LABPROT, INR in the last 72 hours.  Urinalysis: Recent Labs    12/12/17 1703  COLORURINE YELLOW*  LABSPEC 1.015  PHURINE 5.0  GLUCOSEU NEGATIVE  HGBUR SMALL*  BILIRUBINUR NEGATIVE  KETONESUR NEGATIVE  PROTEINUR 30*  NITRITE NEGATIVE  LEUKOCYTESUR NEGATIVE      Imaging: Dg Chest Portable 1 View  Result Date: 12/12/2017 CLINICAL DATA:  Back and chest pain EXAM: PORTABLE CHEST 1 VIEW COMPARISON:  08/04/2017 FINDINGS: Normal heart size and mediastinal contours. Right coronary atherosclerotic calcification. No acute infiltrate or edema. No effusion or pneumothorax. No acute osseous findings. IMPRESSION: No evidence of active disease. Electronically  Signed   By: Monte Fantasia M.D.   On: 12/12/2017 14:10   Ct Angio Chest/abd/pel For Dissection W And/or Wo Contrast  Result Date: 12/12/2017 CLINICAL DATA:  Chest and back pain. EXAM: CT ANGIOGRAPHY CHEST, ABDOMEN AND PELVIS TECHNIQUE: Multidetector CT imaging through the chest, abdomen and pelvis was performed using the standard protocol during bolus administration of intravenous contrast. Multiplanar reconstructed images and MIPs were obtained and reviewed to evaluate the vascular anatomy. CONTRAST:  12mL ISOVUE-370 IOPAMIDOL (ISOVUE-370) INJECTION 76% COMPARISON:  CT scan of March 02, 2017. FINDINGS: CTA CHEST FINDINGS Cardiovascular: Atherosclerosis of thoracic aorta is noted without aneurysm or dissection. Great vessels are widely patent. Normal cardiac size. No pericardial effusion is noted. Coronary artery calcifications are noted. Mediastinum/Nodes: 1.6 cm left thyroid nodule is noted. Esophagus is unremarkable. No significant adenopathy is noted. Lungs/Pleura: Lungs are clear. No pleural effusion or pneumothorax. Musculoskeletal: No chest wall abnormality. No acute or significant osseous findings. Review of the MIP images confirms the above findings. CTA ABDOMEN AND PELVIS FINDINGS VASCULAR Aorta: 4.6 cm infrarenal abdominal aortic aneurysm is noted. No dissection is noted. Mural thrombus is noted. Celiac: Patent without evidence of aneurysm, dissection, vasculitis or significant stenosis. SMA: Patent without evidence of aneurysm, dissection, vasculitis or significant stenosis. Renals: Both renal arteries are patent without evidence of aneurysm, dissection, vasculitis, fibromuscular dysplasia or significant stenosis. IMA: Appears to fill through retrograde flow. Inflow: Atherosclerosis of iliac arteries is noted without significant stenosis. Veins: No obvious venous abnormality within the limitations of this arterial phase study. Review of the MIP images confirms the above findings. NON-VASCULAR  Hepatobiliary: No focal liver abnormality is seen. No gallstones, gallbladder wall thickening, or biliary dilatation. Pancreas: Unremarkable. No pancreatic ductal dilatation or surrounding inflammatory changes. Spleen: Normal in size without focal abnormality. Adrenals/Urinary Tract: Adrenal glands are unremarkable. Kidneys are normal, without renal calculi, focal lesion, or hydronephrosis. Bladder is unremarkable. Stomach/Bowel: Stomach is within normal limits. Appendix appears normal. No evidence of bowel wall thickening, distention, or inflammatory changes. Sigmoid diverticulosis without inflammation. Lymphatic: No significant adenopathy is noted. Reproductive: Status post hysterectomy. No adnexal masses. Other: No abdominal wall hernia or abnormality. No abdominopelvic ascites. Musculoskeletal: No acute or significant osseous findings. Review of the MIP images confirms the above findings. IMPRESSION: There is no evidence of dissection seen in the thoracic or abdominal aorta. Aortic atherosclerosis. 4.6 cm infrarenal abdominal aortic aneurysm is noted. Recommend followup by abdomen and pelvis CTA in 6 months, and vascular surgery referral/consultation  if not already obtained. This recommendation follows ACR consensus guidelines: White Paper of the ACR Incidental Findings Committee II on Vascular Findings. J Am Coll Radiol 2013; 10:789-794. Coronary artery calcifications are noted suggesting coronary artery disease. 1.6 cm left thyroid nodule is noted. Thyroid ultrasound is recommended for further evaluation. Sigmoid diverticulosis is noted without inflammation. No other abnormality seen in the abdomen or pelvis. Electronically Signed   By: Marijo Conception, M.D.   On: 12/12/2017 15:16      Assessment & Plan: Olivia Fields is a 67 y.o. white female with AAA, diastolic congestive heart failure, COPD, coronary artery disease, hypertension, history of ovarian cancer treated with cisplatin,  hyperlipidemia, tobacco abuse, depression , who was admitted to [facility] on 12/12/2017 for Hypotension, unspecified hypotension type [I95.9] Acute renal failure, unspecified acute renal failure type ([place]) [N17.9] Acute midline thoracic back pain [M54.6] Renal failure [N19]   1. Acute renal failure on chronic kidney disease disease stage III: baseline creatinine 1.38 GFR of 40 08/08/17.  Acute renal failure secondary to IV contrast exposure.  Creatinine improving with IV fluids.  - holding losartan and furosemide.   2. Hypertension: blood pressure at goal. Holding home medications. Hypotensive on admission.     LOS: 1 Emilynn Srinivasan 1/30/20193:30 PM

## 2017-12-13 NOTE — Progress Notes (Addendum)
Verified with patient that she does want to be a DNR. Will apply arm band at this time as it was not previously placed. Per Dr. Darvin Neighbours okay to discontinue order for foley as it was never placed. Also per MD no need for intensivist to see patient. As consult had been placed on admission.

## 2017-12-13 NOTE — Progress Notes (Signed)
Olivia Fields  A and O x 4. VSS. Pt tolerating diet well. No complaints of pain or nausea. IV removed intact, no new prescriptions given. Pt voiced understanding of discharge instructions with no further questions. Pt discharged via wheelchair with nurse aide.  Lynann Bologna MSN, RN-BC  Allergies as of 12/13/2017   No Known Allergies     Medication List    STOP taking these medications   losartan 25 MG tablet Commonly known as:  COZAAR   pramipexole 0.125 MG tablet Commonly known as:  MIRAPEX     TAKE these medications   aspirin 81 MG tablet Take 81 mg by mouth daily.   BRILINTA 90 MG Tabs tablet Generic drug:  ticagrelor TAKE 1 TABLET BY MOUTH EVERY 12 HOURS   cetirizine 10 MG tablet Commonly known as:  ZYRTEC Take 10 mg by mouth daily.   FLUoxetine 40 MG capsule Commonly known as:  PROZAC Take 1 capsule (40 mg total) by mouth daily.   fluticasone 50 MCG/ACT nasal spray Commonly known as:  FLONASE Place 1 spray into both nostrils daily.   furosemide 40 MG tablet Commonly known as:  LASIX Take 1 tablet (40 mg total) by mouth as needed for fluid or edema.   Ipratropium-Albuterol 20-100 MCG/ACT Aers respimat Commonly known as:  COMBIVENT Inhale 1 puff into the lungs as needed for wheezing.   isosorbide mononitrate 30 MG 24 hr tablet Commonly known as:  IMDUR Take 30 mg by mouth daily.   metoprolol succinate 25 MG 24 hr tablet Commonly known as:  TOPROL-XL Take 1 tablet (25 mg total) by mouth daily. What changed:  when to take this   nitroGLYCERIN 0.4 MG SL tablet Commonly known as:  NITROSTAT Place 0.4 mg under the tongue every 5 (five) minutes as needed for chest pain.   pregabalin 150 MG capsule Commonly known as:  LYRICA Take 150 mg by mouth 2 (two) times daily.   ranitidine 150 MG tablet Commonly known as:  ZANTAC Take 1 tablet (150 mg total) by mouth 2 (two) times daily.   rosuvastatin 40 MG tablet Commonly known as:  CRESTOR Take 1 tablet  (40 mg total) by mouth daily at 6 PM.   tiotropium 18 MCG inhalation capsule Commonly known as:  SPIRIVA HANDIHALER INHALE CONTENTS OF 1 CAPSULE ONCE DAILY USING HANDIHALER   traMADol 50 MG tablet Commonly known as:  ULTRAM TAKE 1 TABLET BY MOUTH EVERY 8 HOURS AS NEEDED       Vitals:   12/13/17 1200 12/13/17 1559  BP: 115/65 111/82  Pulse: (!) 52   Resp:    Temp: 98.2 F (36.8 C)   SpO2: 100%

## 2017-12-13 NOTE — Discharge Instructions (Signed)
Continue Imdur daily.  Drink plenty of fluids.

## 2017-12-14 NOTE — Discharge Summary (Signed)
[facility] NAME: Olivia Fields    MR#:  [id number]  DATE OF BIRTH:  1951/10/12  DATE OF ADMISSION:  12/12/2017 ADMITTING PHYSICIAN: Bettey Costa, MD  DATE OF DISCHARGE: 12/13/2017  5:24 PM  PRIMARY CARE PHYSICIAN: Konrad Saha, MD   ADMISSION DIAGNOSIS:  Hypotension, unspecified hypotension type [I95.9] Acute renal failure, unspecified acute renal failure type ([place]) [N17.9] Acute midline thoracic back pain [M54.6] Renal failure [N19]  DISCHARGE DIAGNOSIS:  Active Problems:   Hypotension   Renal failure   SECONDARY DIAGNOSIS:   Past Medical History:  Diagnosis Date  . AAA (abdominal aortic aneurysm) ([place])    a. 08/2015 Abd U/S: 3.7cm AAA; b. 02/2017 CTA Abd/Pelvis: 4.4 cm infrarenal AAA.  Marland Kitchen Cancer ([place])    ovarian  . Cardiogenic shock ([place])   . Chronic diastolic CHF (congestive heart failure) ([place])    a. 02/2014 Echo: EF 50-55%, mod inf/inflat HK, diast dysfxn, mild Ao sclerosis w/o stenosis.  . CKD (chronic kidney disease), stage III ([place])   . COPD (chronic obstructive pulmonary disease) ([place])   . Coronary artery disease    a. Previous stenting of LAD, LCx and RCA at [facility];  b. 02/2014 Inf STEMI/PCI: LCX patent stent, RCA 70p, 133m/d (3.5x12, 2.75x18, 2.5x12 Xience Alpine DESs); c. 07/2017 PCI: :M nl, LAD 30p/m ISR, D1 50ost, LCX 20p/m ISR, OM2 60, OM3 40, RCA patent prox stent, 30/22m ISR (2.75x18 Resolute Onyx DES), patent distal stent, EF 45-50%, glob HK.  . H/O ovarian cancer 1990  . Hyperlipidemia   . Hypertension   . MI (myocardial infarction) ([place]) 2002, 2009, 2015     ADMITTING HISTORY  CHIEF COMPLAINT:   Back pain and dizziness HISTORY OF PRESENT ILLNESS:  Olivia Fields  is a 67 y.o. female with a known history of  AAA, chronic diastolic heart failure, COPD and CAD who presents from work with back pain. Her back pain was associated with SOB and nausea. She was concerned that it was a cardiac  symptom. She is also complaining of dizziness and feelings of lightheadedness. She denies chest pain. She was found to have significant hypotension. Her systolic blood pressures were ranging in the 60 to 70s. She has received one and half liters of fluids and her blood pressure is in the 80s. She continues to endorse dizziness. She reports no urinary symptoms such as dysuria, frequency, urgency. She denies abdominal pain. She was found to have elevated creatinine on labs today but consented for CT CHEST to rule out a dissection. CT scan did not show evidence of dissection. ER physician spoke with nephrology consulted and regarding acute kidney injury and contrastgiven today for CT scan.     HOSPITAL COURSE:   *Hypotension *Dehydration *Acute kidney injury *History of abdominal aortic aneurysm *Hypertension  Patient was admitted to the hospitalist service with hypotension.  This responded well to fluid resuscitation.  Patient was also found to have acute kidney injury which is slowly improving.  On admission creatinine was 3.5.  Today it is stool with her baseline being 1.5.  Patient has good urine output.  With her blood pressure medications her M door is being continued.  Losartan stopped.  Metoprolol dose reduced.  Patient was seen by nephrology.  She also had a CT scan of the abdomen which showed no aortic rupture or dissection.  Patient will follow up with her primary care physician and nephrology in 1 week.  Stable blood pressures at time of  discharge.  CONSULTS OBTAINED:  Treatment Team:  Lavonia Dana, MD  DRUG ALLERGIES:  No Known Allergies  DISCHARGE MEDICATIONS:   Allergies as of 12/13/2017   No Known Allergies     Medication List    STOP taking these medications   losartan 25 MG tablet Commonly known as:  COZAAR   pramipexole 0.125 MG tablet Commonly known as:  MIRAPEX     TAKE these medications   aspirin 81 MG tablet Take 81 mg by mouth daily.   BRILINTA  90 MG Tabs tablet Generic drug:  ticagrelor TAKE 1 TABLET BY MOUTH EVERY 12 HOURS   cetirizine 10 MG tablet Commonly known as:  ZYRTEC Take 10 mg by mouth daily.   FLUoxetine 40 MG capsule Commonly known as:  PROZAC Take 1 capsule (40 mg total) by mouth daily.   fluticasone 50 MCG/ACT nasal spray Commonly known as:  FLONASE Place 1 spray into both nostrils daily.   furosemide 40 MG tablet Commonly known as:  LASIX Take 1 tablet (40 mg total) by mouth as needed for fluid or edema.   Ipratropium-Albuterol 20-100 MCG/ACT Aers respimat Commonly known as:  COMBIVENT Inhale 1 puff into the lungs as needed for wheezing.   isosorbide mononitrate 30 MG 24 hr tablet Commonly known as:  IMDUR Take 30 mg by mouth daily.   metoprolol succinate 25 MG 24 hr tablet Commonly known as:  TOPROL-XL Take 1 tablet (25 mg total) by mouth daily. What changed:  when to take this   nitroGLYCERIN 0.4 MG SL tablet Commonly known as:  NITROSTAT Place 0.4 mg under the tongue every 5 (five) minutes as needed for chest pain.   pregabalin 150 MG capsule Commonly known as:  LYRICA Take 150 mg by mouth 2 (two) times daily.   ranitidine 150 MG tablet Commonly known as:  ZANTAC Take 1 tablet (150 mg total) by mouth 2 (two) times daily.   rosuvastatin 40 MG tablet Commonly known as:  CRESTOR Take 1 tablet (40 mg total) by mouth daily at 6 PM.   tiotropium 18 MCG inhalation capsule Commonly known as:  SPIRIVA HANDIHALER INHALE CONTENTS OF 1 CAPSULE ONCE DAILY USING HANDIHALER   traMADol 50 MG tablet Commonly known as:  ULTRAM TAKE 1 TABLET BY MOUTH EVERY 8 HOURS AS NEEDED       Today   VITAL SIGNS:  Blood pressure 111/82, pulse (!) 52, temperature 98.2 F (36.8 C), temperature source Oral, resp. rate 14, height 5\' 2"  (1.575 m), weight 72.6 kg (160 lb), SpO2 100 %.  I/O:  No intake or output data in the 24 hours ending 12/14/17 1729  PHYSICAL EXAMINATION:  Physical Exam  GENERAL:  67  y.o.-year-old patient lying in the bed with no acute distress.  LUNGS: Normal breath sounds bilaterally, no wheezing, rales,rhonchi or crepitation. No use of accessory muscles of respiration.  CARDIOVASCULAR: S1, S2 normal. No murmurs, rubs, or gallops.  ABDOMEN: Soft, non-tender, non-distended. Bowel sounds present. No organomegaly or mass.  NEUROLOGIC: Moves all 4 extremities. PSYCHIATRIC: The patient is alert and oriented x 3.  SKIN: No obvious rash, lesion, or ulcer.   DATA REVIEW:   CBC Recent Labs  Lab 12/13/17 0443  WBC 4.5  HGB 12.7  HCT 37.9  PLT 104*    Chemistries  Recent Labs  Lab 12/13/17 1449  NA 142  K 4.4  CL 113*  CO2 19*  GLUCOSE 87  BUN 33*  CREATININE 2.02*  CALCIUM 8.5*    Cardiac  Enzymes Recent Labs  Lab 12/13/17 1043  TROPONINI <0.03    Microbiology Results  Results for orders placed or performed during the hospital encounter of 12/12/17  Blood culture (routine x 2)     Status: None (Preliminary result)   Collection Time: 12/12/17  5:03 PM  Result Value Ref Range Status   Specimen Description BLOOD RIGHT ARM  Final   Special Requests   Final    BOTTLES DRAWN AEROBIC AND ANAEROBIC Blood Culture adequate volume   Culture   Final    NO GROWTH 2 DAYS Performed at [facility], [address] [place], [place] [address]    Report Status PENDING  Incomplete  Blood culture (routine x 2)     Status: None (Preliminary result)   Collection Time: 12/12/17  5:03 PM  Result Value Ref Range Status   Specimen Description BLOOD RIGHT WRIST  Final   Special Requests   Final    BOTTLES DRAWN AEROBIC AND ANAEROBIC Blood Culture adequate volume   Culture   Final    NO GROWTH 2 DAYS Performed at [facility], [address], [place], [place] [address]    Report Status PENDING  Incomplete    RADIOLOGY:  No results found.  Follow up with PCP in 1 week.  Management plans discussed with the patient, family and they are in  agreement.  CODE STATUS:  Code Status History    Date Active Date Inactive Code Status Order ID Comments User Context   12/12/2017 17:16 12/13/2017 20:29 DNR [id number]  Bettey Costa, MD ED   08/04/2017 11:33 08/08/2017 14:45 Full Code [id number]  Nicholes Mango, MD Inpatient    Questions for Most Recent Historical Code Status (Order [id number])    Question Answer Comment   In the event of cardiac or respiratory ARREST Do not call a "code blue"    In the event of cardiac or respiratory ARREST Do not perform Intubation, CPR, defibrillation or ACLS    In the event of cardiac or respiratory ARREST Use medication by any route, position, wound care, and other measures to relive pain and suffering. May use oxygen, suction and manual treatment of airway obstruction as needed for comfort.       TOTAL TIME TAKING CARE OF THIS PATIENT ON DAY OF DISCHARGE: more than 30 minutes.   Olivia Fields M.D on 12/14/2017 at 5:29 PM  Between 7am to 6pm - Pager - [phone]  After 6pm go to [email or web address] - password [email or web address] [facility]  Office  [phone]  CC: Primary care physician; Konrad Saha, MD  Note: This dictation was prepared with Dragon dictation along with smaller phrase technology. Any transcriptional errors that result from this process are unintentional.

## 2017-12-17 LAB — CULTURE, BLOOD (ROUTINE X 2)
Culture: NO GROWTH
Culture: NO GROWTH
SPECIAL REQUESTS: ADEQUATE
Special Requests: ADEQUATE

## 2018-01-13 ENCOUNTER — Emergency Department: Payer: Medicare PPO

## 2018-01-13 ENCOUNTER — Encounter: Payer: Self-pay | Admitting: Emergency Medicine

## 2018-01-13 ENCOUNTER — Emergency Department
Admission: EM | Admit: 2018-01-13 | Discharge: 2018-01-14 | Disposition: A | Discharge status: Home / Self Care | Payer: Medicare PPO | Attending: Emergency Medicine | Admitting: Emergency Medicine

## 2018-01-13 ENCOUNTER — Other Ambulatory Visit: Payer: Self-pay

## 2018-01-13 DIAGNOSIS — Z87891 Personal history of nicotine dependence: Secondary | ICD-10-CM | POA: Diagnosis not present

## 2018-01-13 DIAGNOSIS — Y999 Unspecified external cause status: Secondary | ICD-10-CM | POA: Insufficient documentation

## 2018-01-13 DIAGNOSIS — S60111A Contusion of right thumb with damage to nail, initial encounter: Secondary | ICD-10-CM | POA: Insufficient documentation

## 2018-01-13 DIAGNOSIS — R0602 Shortness of breath: Secondary | ICD-10-CM | POA: Diagnosis present

## 2018-01-13 DIAGNOSIS — Z7982 Long term (current) use of aspirin: Secondary | ICD-10-CM | POA: Insufficient documentation

## 2018-01-13 DIAGNOSIS — S6010XA Contusion of unspecified finger with damage to nail, initial encounter: Secondary | ICD-10-CM

## 2018-01-13 DIAGNOSIS — J40 Bronchitis, not specified as acute or chronic: Secondary | ICD-10-CM | POA: Insufficient documentation

## 2018-01-13 DIAGNOSIS — Z79899 Other long term (current) drug therapy: Secondary | ICD-10-CM | POA: Diagnosis not present

## 2018-01-13 DIAGNOSIS — W230XXA Caught, crushed, jammed, or pinched between moving objects, initial encounter: Secondary | ICD-10-CM | POA: Diagnosis not present

## 2018-01-13 DIAGNOSIS — Y929 Unspecified place or not applicable: Secondary | ICD-10-CM | POA: Insufficient documentation

## 2018-01-13 DIAGNOSIS — N183 Chronic kidney disease, stage 3 (moderate): Secondary | ICD-10-CM | POA: Diagnosis not present

## 2018-01-13 DIAGNOSIS — I2510 Atherosclerotic heart disease of native coronary artery without angina pectoris: Secondary | ICD-10-CM | POA: Insufficient documentation

## 2018-01-13 DIAGNOSIS — S62524A Nondisplaced fracture of distal phalanx of right thumb, initial encounter for closed fracture: Secondary | ICD-10-CM | POA: Insufficient documentation

## 2018-01-13 DIAGNOSIS — I5032 Chronic diastolic (congestive) heart failure: Secondary | ICD-10-CM | POA: Insufficient documentation

## 2018-01-13 DIAGNOSIS — S62639A Displaced fracture of distal phalanx of unspecified finger, initial encounter for closed fracture: Secondary | ICD-10-CM

## 2018-01-13 DIAGNOSIS — I130 Hypertensive heart and chronic kidney disease with heart failure and stage 1 through stage 4 chronic kidney disease, or unspecified chronic kidney disease: Secondary | ICD-10-CM | POA: Diagnosis not present

## 2018-01-13 DIAGNOSIS — Y9389 Activity, other specified: Secondary | ICD-10-CM | POA: Diagnosis not present

## 2018-01-13 MED ORDER — ALBUTEROL SULFATE HFA 108 (90 BASE) MCG/ACT IN AERS: 2.0000 | INHALATION_SPRAY | Freq: Four times a day (QID) | RESPIRATORY_TRACT | 0 refills | Status: AC | PRN

## 2018-01-13 MED ORDER — LIDOCAINE HCL (PF) 1 % IJ SOLN
5.0000 mL | Freq: Once | INTRAMUSCULAR | Status: AC
  Administered 2018-01-13: 5 mL

## 2018-01-13 MED ORDER — ALBUTEROL SULFATE (2.5 MG/3ML) 0.083% IN NEBU
5.0000 mg | INHALATION_SOLUTION | Freq: Once | RESPIRATORY_TRACT | Status: AC
  Administered 2018-01-13: 5 mg via RESPIRATORY_TRACT
  Filled 2018-01-13: qty 6

## 2018-01-13 NOTE — ED Triage Notes (Signed)
Pt reports hx of pneumonia and is concerned that she may have pneumonia at this time. Pt also reports slamming her right thumb into the car door and it was stuck for 2-3 minutes with no relief. Pt's rt thumb appears bruised and swollen at this time. Pt is in NAD.

## 2018-01-13 NOTE — Discharge Instructions (Signed)
Please keep your splint on your thumb at all times for comfort and follow-up with the hand surgeon within the next few days for reevaluation.  Return to the emergency department sooner for any new or worsening symptoms such as worsening pain, fevers, chills, or for any other issues whatsoever.  It was a pleasure to take care of you today, and thank you for coming to our emergency department.  If you have any questions or concerns before leaving please ask the nurse to grab me and I'm more than happy to go through your aftercare instructions again.  If you were prescribed any opioid pain medication today such as Norco, Vicodin, Percocet, morphine, hydrocodone, or oxycodone please make sure you do not drive when you are taking this medication as it can alter your ability to drive safely.  If you have any concerns once you are home that you are not improving or are in fact getting worse before you can make it to your follow-up appointment, please do not hesitate to call [phone] and come back for further evaluation.  Darel Hong, MD  Results for orders placed or performed during the hospital encounter of 12/12/17  Blood culture (routine x 2)  Result Value Ref Range   Specimen Description BLOOD RIGHT ARM    Special Requests      BOTTLES DRAWN AEROBIC AND ANAEROBIC Blood Culture adequate volume   Culture      NO GROWTH 5 DAYS Performed at [facility], [address] [place], [place] [address]    Report Status 12/17/2017 FINAL   Blood culture (routine x 2)  Result Value Ref Range   Specimen Description BLOOD RIGHT WRIST    Special Requests      BOTTLES DRAWN AEROBIC AND ANAEROBIC Blood Culture adequate volume   Culture      NO GROWTH 5 DAYS Performed at [facility], [address] [place], [place] [address]    Report Status 12/17/2017 FINAL   Basic metabolic panel  Result Value Ref Range   Sodium 137 135 - 145 mmol/L   Potassium 4.6 3.5 - 5.1 mmol/L   Chloride 105  101 - 111 mmol/L   CO2 19 (L) 22 - 32 mmol/L   Glucose, Bld 98 65 - 99 mg/dL   BUN 41 (H) 6 - 20 mg/dL   Creatinine, Ser 3.24 (H) 0.44 - 1.00 mg/dL   Calcium 9.3 8.9 - 10.3 mg/dL   GFR calc non Af Amer 14 (L) >60 mL/min   GFR calc Af Amer 16 (L) >60 mL/min   Anion gap 13 5 - 15  CBC  Result Value Ref Range   WBC 6.1 3.6 - 11.0 K/uL   RBC 4.38 3.80 - 5.20 MIL/uL   Hemoglobin 13.2 12.0 - 16.0 g/dL   HCT 39.6 35.0 - 47.0 %   MCV 90.4 80.0 - 100.0 fL   MCH 30.1 26.0 - 34.0 pg   MCHC 33.4 32.0 - 36.0 g/dL   RDW 14.4 11.5 - 14.5 %   Platelets 154 150 - 440 K/uL  Troponin I  Result Value Ref Range   Troponin I <0.03 <0.03 ng/mL  Urinalysis, Complete w Microscopic  Result Value Ref Range   Color, Urine YELLOW (A) YELLOW   APPearance HAZY (A) CLEAR   Specific Gravity, Urine 1.015 1.005 - 1.030   pH 5.0 5.0 - 8.0   Glucose, UA NEGATIVE NEGATIVE mg/dL   Hgb urine dipstick SMALL (A) NEGATIVE   Bilirubin Urine NEGATIVE NEGATIVE  Ketones, ur NEGATIVE NEGATIVE mg/dL   Protein, ur 30 (A) NEGATIVE mg/dL   Nitrite NEGATIVE NEGATIVE   Leukocytes, UA NEGATIVE NEGATIVE   RBC / HPF 0-5 0 - 5 RBC/hpf   WBC, UA 0-5 0 - 5 WBC/hpf   Bacteria, UA RARE (A) NONE SEEN   Squamous Epithelial / LPF 0-5 (A) NONE SEEN   Mucus PRESENT    Hyaline Casts, UA PRESENT   Troponin I  Result Value Ref Range   Troponin I <0.03 <0.03 ng/mL  Troponin I  Result Value Ref Range   Troponin I <0.03 <0.03 ng/mL  Troponin I  Result Value Ref Range   Troponin I <0.03 <0.03 ng/mL  Cortisol  Result Value Ref Range   Cortisol, Plasma 8.8 ug/dL  Basic metabolic panel  Result Value Ref Range   Sodium 139 135 - 145 mmol/L   Potassium 4.1 3.5 - 5.1 mmol/L   Chloride 112 (H) 101 - 111 mmol/L   CO2 20 (L) 22 - 32 mmol/L   Glucose, Bld 96 65 - 99 mg/dL   BUN 36 (H) 6 - 20 mg/dL   Creatinine, Ser 2.41 (H) 0.44 - 1.00 mg/dL   Calcium 8.1 (L) 8.9 - 10.3 mg/dL   GFR calc non Af Amer 20 (L) >60 mL/min   GFR calc Af  Amer 23 (L) >60 mL/min   Anion gap 7 5 - 15  CBC  Result Value Ref Range   WBC 4.5 3.6 - 11.0 K/uL   RBC 4.15 3.80 - 5.20 MIL/uL   Hemoglobin 12.7 12.0 - 16.0 g/dL   HCT 37.9 35.0 - 47.0 %   MCV 91.4 80.0 - 100.0 fL   MCH 30.5 26.0 - 34.0 pg   MCHC 33.4 32.0 - 36.0 g/dL   RDW 14.7 (H) 11.5 - 14.5 %   Platelets 104 (L) 150 - 440 K/uL  Basic metabolic panel  Result Value Ref Range   Sodium 142 135 - 145 mmol/L   Potassium 4.4 3.5 - 5.1 mmol/L   Chloride 113 (H) 101 - 111 mmol/L   CO2 19 (L) 22 - 32 mmol/L   Glucose, Bld 87 65 - 99 mg/dL   BUN 33 (H) 6 - 20 mg/dL   Creatinine, Ser 2.02 (H) 0.44 - 1.00 mg/dL   Calcium 8.5 (L) 8.9 - 10.3 mg/dL   GFR calc non Af Amer 25 (L) >60 mL/min   GFR calc Af Amer 29 (L) >60 mL/min   Anion gap 10 5 - 15   Dg Chest 2 View  Result Date: 01/13/2018 CLINICAL DATA:  Shortness of breath. EXAM: CHEST  2 VIEW COMPARISON:  Radiographs and CT 12/12/2017 FINDINGS: The cardiomediastinal contours are normal. Coronary artery calcifications or stent. Minimal linear atelectasis or scarring in the right upper lung zone. Mild bronchial thickening, new from prior. Pulmonary vasculature is normal. No consolidation, pleural effusion, or pneumothorax. No acute osseous abnormalities are seen. IMPRESSION: New bronchial thickening suggesting acute bronchitis. Electronically Signed   By: Jeb Levering M.D.   On: 01/13/2018 22:53   Dg Hand Complete Right  Result Date: 01/13/2018 CLINICAL DATA:  Right hand/thumb pain after slamming thumb in car door. Bruising and swelling. EXAM: RIGHT HAND - COMPLETE 3+ VIEW COMPARISON:  None. FINDINGS: Oblique fracture of the thumb distal phalanx involves the distal aspect. No significant displacement or angulation. No intra-articular extension. No additional acute fracture of the hand. Minimal osteoarthritis of the digits. No radiopaque foreign body. IMPRESSION: Nondisplaced thumb distal phalanx fracture  without intra-articular extension.  Electronically Signed   By: Jeb Levering M.D.   On: 01/13/2018 22:51

## 2018-01-13 NOTE — ED Provider Notes (Signed)
[facility] Emergency Department Provider Note  ____________________________________________   First MD Initiated Contact with Patient 01/13/18 2311     (approximate)  I have reviewed the triage vital signs and the nursing notes.   HISTORY  Chief Complaint Shortness of Breath and Finger Injury   HPI Olivia Fields is a 66 y.o. female who self presents to the emergency department with 2 issues.  First she had an upper respiratory tract infection that she caught from her granddaughter about 4 days ago.  She has had rhinorrhea and dry cough and she is frustrated because her symptoms have not completely resolved.  The cough is keeping her up at night.  Her cough is worse at night.  Is nonproductive.  No fevers or chills.  She has tried codeine cough syrup and Tessalon Perles without improvement.  She has sharp moderate severity diffuse upper chest pain worse when coughing improved when not coughing.  She also slammed a car door onto her right thumbnail this afternoon sustaining sudden onset severe pain.  Her tetanus is up-to-date.  Past Medical History:  Diagnosis Date  . AAA (abdominal aortic aneurysm) ([place])    a. 08/2015 Abd U/S: 3.7cm AAA; b. 02/2017 CTA Abd/Pelvis: 4.4 cm infrarenal AAA.  Marland Kitchen Cancer ([place])    ovarian  . Cardiogenic shock ([place])   . Chronic diastolic CHF (congestive heart failure) ([place])    a. 02/2014 Echo: EF 50-55%, mod inf/inflat HK, diast dysfxn, mild Ao sclerosis w/o stenosis.  . CKD (chronic kidney disease), stage III ([place])   . COPD (chronic obstructive pulmonary disease) ([place])   . Coronary artery disease    a. Previous stenting of LAD, LCx and RCA at [facility];  b. 02/2014 Inf STEMI/PCI: LCX patent stent, RCA 70p, 164m/d (3.5x12, 2.75x18, 2.5x12 Xience Alpine DESs); c. 07/2017 PCI: :M nl, LAD 30p/m ISR, D1 50ost, LCX 20p/m ISR, OM2 60, OM3 40, RCA patent prox stent, 30/80m ISR (2.75x18 Resolute Onyx DES), patent distal stent, EF 45-50%, glob HK.    . H/O ovarian cancer 1990  . Hyperlipidemia   . Hypertension   . MI (myocardial infarction) ([place]) 2002, 2009, 2015    Patient Active Problem List   Diagnosis Date Noted  . Hypotension 12/12/2017  . Renal failure 12/12/2017  . Chest pain 08/04/2017  . Drainage from left ear 12/18/2014  . Back pain 12/18/2014  . Restless leg syndrome 12/18/2014  . Otalgia 06/02/2014  . Coronary artery disease   . Hyperlipidemia   . Hypertension     Past Surgical History:  Procedure Laterality Date  . CARDIAC CATHETERIZATION  2004   [facility];x1 stent  . CARDIAC CATHETERIZATION  2006   [facility];x2 stent  . CARDIAC CATHETERIZATION  02/2014   [facility];x3 stent  . CORONARY STENT INTERVENTION N/A 08/07/2017   Procedure: CORONARY STENT INTERVENTION;  Surgeon: Wellington Hampshire, MD;  Location: [place] [facility];  Service: Cardiovascular;  Laterality: N/A;  . LEFT HEART CATH AND CORONARY ANGIOGRAPHY N/A 08/07/2017   Procedure: LEFT HEART CATH AND CORONARY ANGIOGRAPHY;  Surgeon: Wellington Hampshire, MD;  Location: [place] [facility];  Service: Cardiovascular;  Laterality: N/A;  . TOTAL ABDOMINAL HYSTERECTOMY W/ BILATERAL SALPINGOOPHORECTOMY  1990   ovarian cancer    Prior to Admission medications   Medication Sig Start Date End Date Taking? Authorizing Provider  albuterol (PROVENTIL HFA;VENTOLIN HFA) 108 (90 Base) MCG/ACT inhaler Inhale 2 puffs into the lungs every 6 (six) hours as needed for wheezing or shortness of breath.  01/13/18   Darel Hong, MD  aspirin 81 MG tablet Take 81 mg by mouth daily.    [provider]  BRILINTA 90 MG TABS tablet TAKE 1 TABLET BY MOUTH EVERY 12 HOURS 04/30/15   Wellington Hampshire, MD  cetirizine (ZYRTEC) 10 MG tablet Take 10 mg by mouth daily.    [provider]  FLUoxetine (PROZAC) 40 MG capsule Take 1 capsule (40 mg total) by mouth daily. 12/18/14   Rubbie Battiest, RN  fluticasone (FLONASE) 50 MCG/ACT nasal spray Place 1 spray into both nostrils daily.     [provider]  furosemide (LASIX) 40 MG tablet Take 1 tablet (40 mg total) by mouth as needed for fluid or edema. 12/18/14   Rubbie Battiest, RN  Ipratropium-Albuterol (COMBIVENT) 20-100 MCG/ACT AERS respimat Inhale 1 puff into the lungs as needed for wheezing. Patient not taking: Reported on 12/12/2017 12/18/14   Rubbie Battiest, RN  isosorbide mononitrate (IMDUR) 30 MG 24 hr tablet Take 30 mg by mouth daily.    [provider]  metoprolol succinate (TOPROL-XL) 25 MG 24 hr tablet Take 1 tablet (25 mg total) by mouth daily. 12/13/17   Hillary Bow, MD  nitroGLYCERIN (NITROSTAT) 0.4 MG SL tablet Place 0.4 mg under the tongue every 5 (five) minutes as needed for chest pain.    [provider]  pregabalin (LYRICA) 150 MG capsule Take 150 mg by mouth 2 (two) times daily.    [provider]  ranitidine (ZANTAC) 150 MG tablet Take 1 tablet (150 mg total) by mouth 2 (two) times daily. 12/18/14   Rubbie Battiest, RN  rosuvastatin (CRESTOR) 40 MG tablet Take 1 tablet (40 mg total) by mouth daily at 6 PM. 08/08/17   Henreitta Leber, MD  tiotropium (SPIRIVA HANDIHALER) 18 MCG inhalation capsule INHALE CONTENTS OF 1 CAPSULE ONCE DAILY USING HANDIHALER Patient not taking: Reported on 08/04/2017 12/18/14   Rubbie Battiest, RN  traMADol (ULTRAM) 50 MG tablet TAKE 1 TABLET BY MOUTH EVERY 8 HOURS AS NEEDED 05/08/15   Jackolyn Confer, MD    Allergies Patient has no known allergies.  Family History  Problem Relation Age of Onset  . Heart disease Mother   . Heart attack Mother   . Diabetes Mother   . Cancer Mother        Tonsils/Throat  . Heart disease Maternal Aunt   . Heart disease Maternal Aunt   . Heart disease Maternal Aunt   . Heart disease Maternal Aunt   . Heart disease Maternal Aunt   . Cancer Father        Lung  . Aneurysm Father     Social History Social History   Tobacco Use  . Smoking status: Former Smoker    Packs/day: 1.00    Years: 35.00    Pack years:  35.00    Types: Cigarettes    Last attempt to quit: 03/11/2010    Years since quitting: 7.8  . Smokeless tobacco: Never Used  . Tobacco comment: currently smoking 7-8 cigarettes/day  Substance Use Topics  . Alcohol use: Yes    Comment: rare  . Drug use: No    Review of Systems Constitutional: No fever/chills Eyes: No visual changes. ENT: No sore throat. Cardiovascular: Positive for chest pain. Respiratory: Positive for shortness of breath. Gastrointestinal: No abdominal pain.  No nausea, no vomiting.  No diarrhea.  No constipation. Genitourinary: Negative for dysuria. Musculoskeletal: Negative for back pain. Skin: Negative for  rash. Neurological: Negative for headaches, focal weakness or numbness.   ____________________________________________   PHYSICAL EXAM:  VITAL SIGNS: ED Triage Vitals  Enc Vitals Group     BP 01/13/18 2150 128/79     Pulse Rate 01/13/18 2150 72     Resp 01/13/18 2150 20     Temp 01/13/18 2150 99.3 F (37.4 C)     Temp Source 01/13/18 2150 Oral     SpO2 01/13/18 2150 97 %     Weight 01/13/18 2149 165 lb (74.8 kg)     Height 01/13/18 2149 5\' 2"  (1.575 m)     Head Circumference --      Peak Flow --      Pain Score 01/13/18 2149 6     Pain Loc --      Pain Edu? --      Excl. in [place]? --     Constitutional: Alert and oriented x4 dry cough nontoxic no diaphoresis speaks in full clear sentences Eyes: PERRL EOMI. Head: Atraumatic. Nose: No congestion/rhinnorhea. Mouth/Throat: No trismus uvula midline no pharyngeal erythema or exudate Neck: No stridor.   Cardiovascular: Normal rate, regular rhythm. Grossly normal heart sounds.  Good peripheral circulation. Respiratory: Slightly increased respiratory effort with mild wheeze throughout lung sounds equal bilaterally Gastrointestinal: Soft nontender Musculoskeletal: No tenderness over distal radius or distal ulna. No tenderness over snuffbox and no axial load discomfort Sensation intact to light  touch over first dorsal webspace, distal index finger, distal small finger Can flex and oppose  thumb, cross 2 on 3, and extend wrist 2+ radial pulse and less than 2 second capillary refill Compartments are soft. Subungual hematoma on the right thumb taking about 50% of the thumb nail  Neurologic:  Normal speech and language. No gross focal neurologic deficits are appreciated. Skin:  Skin is warm, dry and intact. No rash noted. Psychiatric: Mood and affect are normal. Speech and behavior are normal.    ____________________________________________   DIFFERENTIAL includes but not limited to  Bronchitis, pneumonia, pneumothorax, pulmonary embolism, pleural effusion, CHF, tuft fracture, subungual hematoma ____________________________________________   LABS (all labs ordered are listed, but only abnormal results are displayed)  Labs Reviewed - No data to display   __________________________________________  EKG  ED ECG REPORT I, Darel Hong, the attending physician, personally viewed and interpreted this ECG.  Date: 01/13/2018 EKG Time:  Rate: 70 Rhythm: normal sinus rhythm QRS Axis: leftward Intervals: normal ST/T Wave abnormalities: normal Narrative Interpretation: no evidence of acute ischemia  ____________________________________________  RADIOLOGY  Chest x-ray reviewed by me consistent with bronchitis Hand x-ray reviewed by me consistent with minimally displaced distal phalanx fracture ____________________________________________   PROCEDURES  Procedure(s) performed: Yes  .Nerve Block Date/Time: 01/13/2018 11:37 PM Performed by: Darel Hong, MD Authorized by: Darel Hong, MD   Consent:    Consent obtained:  Verbal   Consent given by:  Patient   Risks discussed:  Infection, nerve damage, swelling, pain and unsuccessful block Indications:    Indications:  Pain relief and procedural anesthesia Location:    Body area:  Upper extremity    Laterality:  Right Pre-procedure details:    Skin preparation:  Alcohol Skin anesthesia (see MAR for exact dosages):    Skin anesthesia method:  None Procedure details (see MAR for exact dosages):    Block needle gauge:  30 G   Anesthetic injected:  Bupivacaine 0.5% w/o epi   Injection procedure:  Anatomic landmarks identified Post-procedure details:    Dressing:  None  Outcome:  Anesthesia achieved Comments:     Digital block     Critical Care performed: no  Observation: no ____________________________________________   INITIAL IMPRESSION / ASSESSMENT AND PLAN / ED COURSE  Pertinent labs & imaging results that were available during my care of the patient were reviewed by me and considered in my medical decision making (see chart for details).  Regarding the patient's persistent cough her symptoms are most consistent with viral URI and bronchitis.  I nebulized 5 cc of 1% lidocaine without epinephrine with complete resolution of her cough.  She does have slight wheeze so I will prescribe her albuterol for home.  No indication for antibiotics at this time.  Regarding her right thumb performed a digital block with complete anesthesia and then used electrocautery to trephinate her nail relieving subungual hematoma and pressure.  Her hand was then splinted and I will refer her to orthopedic surgery as an outpatient.  Patient declines pain medication for home.  She is discharged home in improved condition verbalized understanding agree with plan.      ____________________________________________   FINAL CLINICAL IMPRESSION(S) / ED DIAGNOSES  Final diagnoses:  Bronchitis  Subungual hematoma of digit of hand, initial encounter  Closed fracture of tuft of distal phalanx of finger      NEW MEDICATIONS STARTED DURING THIS VISIT:  New Prescriptions   ALBUTEROL (PROVENTIL HFA;VENTOLIN HFA) 108 (90 BASE) MCG/ACT INHALER    Inhale 2 puffs into the lungs every 6 (six) hours as  needed for wheezing or shortness of breath.     Note:  This document was prepared using Dragon voice recognition software and may include unintentional dictation errors.     Darel Hong, MD 01/14/18 [phone]

## 2018-01-14 NOTE — ED Notes (Signed)
Topez not working 

## 2018-07-30 ENCOUNTER — Encounter: Payer: Self-pay | Admitting: Emergency Medicine

## 2018-07-30 ENCOUNTER — Other Ambulatory Visit: Payer: Self-pay

## 2018-07-30 ENCOUNTER — Ambulatory Visit
Admission: EM | Admit: 2018-07-30 | Discharge: 2018-07-30 | Disposition: A | Discharge status: Home / Self Care | Payer: Medicare PPO | Attending: Family Medicine | Admitting: Family Medicine

## 2018-07-30 DIAGNOSIS — H6123 Impacted cerumen, bilateral: Secondary | ICD-10-CM | POA: Diagnosis not present

## 2018-07-30 DIAGNOSIS — J302 Other seasonal allergic rhinitis: Secondary | ICD-10-CM

## 2018-07-30 DIAGNOSIS — H9203 Otalgia, bilateral: Secondary | ICD-10-CM

## 2018-07-30 DIAGNOSIS — H9193 Unspecified hearing loss, bilateral: Secondary | ICD-10-CM | POA: Diagnosis not present

## 2018-07-30 NOTE — ED Triage Notes (Signed)
Patient c/o bilateral ear pain and fullness that started on Saturday.  Patient denies fevers.

## 2018-07-30 NOTE — ED Provider Notes (Signed)
[facility]    CSN: [id number] Arrival date & time: 07/30/18  1634     History   Chief Complaint Chief Complaint  Patient presents with  . Otalgia  . Ear Fullness    HPI Olivia Fields is a 67 y.o. female.   Patient is a 67 year old female who presents with complaint of ear pressure.  Patient states it feels like she is in a tunnel.  She also reports her hearing being muffled.  She did report some clear drainage couple days ago but that is no longer occurring.  Patient denies any fever or headache.  She does report some nasal congestion and eye tearing as well.  She takes a daily Flonase.  She does report seasonal allergies and reports that she is currently having some issues with the ragweed season.     Past Medical History:  Diagnosis Date  . AAA (abdominal aortic aneurysm) ([place])    a. 08/2015 Abd U/S: 3.7cm AAA; b. 02/2017 CTA Abd/Pelvis: 4.4 cm infrarenal AAA.  Marland Kitchen Cancer ([place])    ovarian  . Cardiogenic shock ([place])   . Chronic diastolic CHF (congestive heart failure) ([place])    a. 02/2014 Echo: EF 50-55%, mod inf/inflat HK, diast dysfxn, mild Ao sclerosis w/o stenosis.  . CKD (chronic kidney disease), stage III ([place])   . COPD (chronic obstructive pulmonary disease) ([place])   . Coronary artery disease    a. Previous stenting of LAD, LCx and RCA at [facility];  b. 02/2014 Inf STEMI/PCI: LCX patent stent, RCA 70p, 155m/d (3.5x12, 2.75x18, 2.5x12 Xience Alpine DESs); c. 07/2017 PCI: :M nl, LAD 30p/m ISR, D1 50ost, LCX 20p/m ISR, OM2 60, OM3 40, RCA patent prox stent, 30/63m ISR (2.75x18 Resolute Onyx DES), patent distal stent, EF 45-50%, glob HK.  . H/O ovarian cancer 1990  . Hyperlipidemia   . Hypertension   . MI (myocardial infarction) ([place]) 2002, 2009, 2015    Patient Active Problem List   Diagnosis Date Noted  . Hypotension 12/12/2017  . Renal failure 12/12/2017  . Chest pain 08/04/2017  . Drainage from left ear 12/18/2014  . Back pain 12/18/2014  . Restless leg  syndrome 12/18/2014  . Otalgia 06/02/2014  . Coronary artery disease   . Hyperlipidemia   . Hypertension     Past Surgical History:  Procedure Laterality Date  . CARDIAC CATHETERIZATION  2004   [facility];x1 stent  . CARDIAC CATHETERIZATION  2006   [facility];x2 stent  . CARDIAC CATHETERIZATION  02/2014   [facility];x3 stent  . CORONARY STENT INTERVENTION N/A 08/07/2017   Procedure: CORONARY STENT INTERVENTION;  Surgeon: Wellington Hampshire, MD;  Location: [place] [facility];  Service: Cardiovascular;  Laterality: N/A;  . LEFT HEART CATH AND CORONARY ANGIOGRAPHY N/A 08/07/2017   Procedure: LEFT HEART CATH AND CORONARY ANGIOGRAPHY;  Surgeon: Wellington Hampshire, MD;  Location: [place] [facility];  Service: Cardiovascular;  Laterality: N/A;  . TOTAL ABDOMINAL HYSTERECTOMY W/ BILATERAL SALPINGOOPHORECTOMY  1990   ovarian cancer    OB History   None      Home Medications    Prior to Admission medications   Medication Sig Start Date End Date Taking? Authorizing Provider  aspirin 81 MG tablet Take 81 mg by mouth daily.   Yes [provider]  BRILINTA 90 MG TABS tablet TAKE 1 TABLET BY MOUTH EVERY 12 HOURS 04/30/15  Yes Wellington Hampshire, MD  cetirizine (ZYRTEC) 10 MG tablet Take 10 mg by mouth daily.   Yes [provider]  FLUoxetine (PROZAC) 40 MG capsule Take 1 capsule (40 mg total) by mouth daily. 12/18/14  Yes Doss, Velora Heckler, RN  fluticasone (FLONASE) 50 MCG/ACT nasal spray Place 1 spray into both nostrils daily.   Yes [provider]  isosorbide mononitrate (IMDUR) 30 MG 24 hr tablet Take 30 mg by mouth daily.   Yes [provider]  metoprolol succinate (TOPROL-XL) 25 MG 24 hr tablet Take 1 tablet (25 mg total) by mouth daily. 12/13/17  Yes Sudini, Alveta Heimlich, MD  pregabalin (LYRICA) 150 MG capsule Take 150 mg by mouth 2 (two) times daily.   Yes [provider]  ranitidine (ZANTAC) 150 MG tablet Take 1 tablet (150 mg total) by mouth 2 (two) times daily. 12/18/14   Yes Doss, Velora Heckler, RN  rosuvastatin (CRESTOR) 40 MG tablet Take 1 tablet (40 mg total) by mouth daily at 6 PM. 08/08/17  Yes Sainani, Belia Heman, MD  albuterol (PROVENTIL HFA;VENTOLIN HFA) 108 (90 Base) MCG/ACT inhaler Inhale 2 puffs into the lungs every 6 (six) hours as needed for wheezing or shortness of breath. 01/13/18   Darel Hong, MD  Ipratropium-Albuterol (COMBIVENT) 20-100 MCG/ACT AERS respimat Inhale 1 puff into the lungs as needed for wheezing. Patient not taking: Reported on 12/12/2017 12/18/14   Rubbie Battiest, RN  nitroGLYCERIN (NITROSTAT) 0.4 MG SL tablet Place 0.4 mg under the tongue every 5 (five) minutes as needed for chest pain.    [provider]    Family History Family History  Problem Relation Age of Onset  . Heart disease Mother   . Heart attack Mother   . Diabetes Mother   . Cancer Mother        Tonsils/Throat  . Heart disease Maternal Aunt   . Heart disease Maternal Aunt   . Heart disease Maternal Aunt   . Heart disease Maternal Aunt   . Heart disease Maternal Aunt   . Cancer Father        Lung  . Aneurysm Father     Social History Social History   Tobacco Use  . Smoking status: Former Smoker    Packs/day: 1.00    Years: 35.00    Pack years: 35.00    Types: Cigarettes    Last attempt to quit: 03/11/2010    Years since quitting: 8.3  . Smokeless tobacco: Never Used  . Tobacco comment: currently smoking 7-8 cigarettes/day  Substance Use Topics  . Alcohol use: Yes    Comment: rare  . Drug use: No     Allergies   Patient has no known allergies.   Review of Systems Review of Systems as noted above in HPI.  Other systems reviewed and found to be negative   Physical Exam Triage Vital Signs ED Triage Vitals  Enc Vitals Group     BP 07/30/18 1656 100/62     Pulse Rate 07/30/18 1656 63     Resp 07/30/18 1656 14     Temp 07/30/18 1656 98.1 F (36.7 C)     Temp Source 07/30/18 1656 Oral     SpO2 07/30/18 1656 98 %     Weight 07/30/18  1653 160 lb (72.6 kg)     Height 07/30/18 1653 5\' 2"  (1.575 m)     Head Circumference --      Peak Flow --      Pain Score 07/30/18 1653 2     Pain Loc --      Pain Edu? --  Excl. in [place]? --    No data found.  Updated Vital Signs BP 100/62 (BP Location: Right Arm)   Pulse 63   Temp 98.1 F (36.7 C) (Oral)   Resp 14   Ht 5\' 2"  (1.575 m)   Wt 160 lb (72.6 kg)   SpO2 98%   BMI 29.26 kg/m   Physical Exam  Constitutional: She is oriented to person, place, and time. She appears well-developed and well-nourished. No distress.  HENT:  Head: Normocephalic and atraumatic.  Right Ear: Tympanic membrane and ear canal normal.  Left Ear: Tympanic membrane and ear canal normal.  Nose: Right sinus exhibits no maxillary sinus tenderness and no frontal sinus tenderness. Left sinus exhibits no maxillary sinus tenderness and no frontal sinus tenderness.  Mouth/Throat: Uvula is midline and mucous membranes are normal. Tonsils are 0 on the right. Tonsils are 0 on the left.  Eyes: Pupils are equal, round, and reactive to light. EOM are normal.  Cardiovascular: Normal rate, regular rhythm and normal heart sounds.  Pulmonary/Chest: Effort normal and breath sounds normal. No respiratory distress.  Abdominal: Soft. Bowel sounds are normal.  Musculoskeletal: Normal range of motion.  Neurological: She is alert and oriented to person, place, and time.  Skin: Skin is warm and dry.     UC Treatments / Results  Labs (all labs ordered are listed, but only abnormal results are displayed) Labs Reviewed - No data to display  EKG None  Radiology No results found.  Procedures Procedures (including critical care time)  Medications Ordered in UC Medications - No data to display  Initial Impression / Assessment and Plan / UC Course  I have reviewed the triage vital signs and the nursing notes.  Pertinent labs & imaging results that were available during my care of the patient were reviewed by  me and considered in my medical decision making (see chart for details).     Patient reports improvement in the fullness and hearing after ear irrigation.  Good removal of her wax. Final Clinical Impressions(s) / UC Diagnoses   Final diagnoses:  Otalgia of both ears  Seasonal allergies     Discharge Instructions     -continue Flonase -can add over the counter Claritin or Zyrtec at night -    ED Prescriptions    None     Controlled Substance Prescriptions [place] Controlled Substance Registry consulted? Not Applicable   Luvenia Redden, PA-C 07/30/18 [id number]

## 2018-07-30 NOTE — Discharge Instructions (Addendum)
-  continue Flonase -can add over the counter Claritin or Zyrtec at night

## 2018-09-01 ENCOUNTER — Other Ambulatory Visit: Payer: Self-pay

## 2018-09-01 ENCOUNTER — Encounter: Payer: Self-pay | Admitting: Emergency Medicine

## 2018-09-01 ENCOUNTER — Emergency Department: Payer: Medicare PPO

## 2018-09-01 ENCOUNTER — Emergency Department
Admission: EM | Admit: 2018-09-01 | Discharge: 2018-09-01 | Disposition: A | Discharge status: Home / Self Care | Payer: Medicare PPO | Attending: Emergency Medicine | Admitting: Emergency Medicine

## 2018-09-01 DIAGNOSIS — I5032 Chronic diastolic (congestive) heart failure: Secondary | ICD-10-CM | POA: Diagnosis not present

## 2018-09-01 DIAGNOSIS — Z87891 Personal history of nicotine dependence: Secondary | ICD-10-CM | POA: Insufficient documentation

## 2018-09-01 DIAGNOSIS — M25551 Pain in right hip: Secondary | ICD-10-CM | POA: Diagnosis not present

## 2018-09-01 DIAGNOSIS — I130 Hypertensive heart and chronic kidney disease with heart failure and stage 1 through stage 4 chronic kidney disease, or unspecified chronic kidney disease: Secondary | ICD-10-CM | POA: Insufficient documentation

## 2018-09-01 DIAGNOSIS — Y929 Unspecified place or not applicable: Secondary | ICD-10-CM | POA: Insufficient documentation

## 2018-09-01 DIAGNOSIS — J449 Chronic obstructive pulmonary disease, unspecified: Secondary | ICD-10-CM | POA: Insufficient documentation

## 2018-09-01 DIAGNOSIS — N183 Chronic kidney disease, stage 3 (moderate): Secondary | ICD-10-CM | POA: Insufficient documentation

## 2018-09-01 DIAGNOSIS — I2510 Atherosclerotic heart disease of native coronary artery without angina pectoris: Secondary | ICD-10-CM | POA: Insufficient documentation

## 2018-09-01 DIAGNOSIS — Y999 Unspecified external cause status: Secondary | ICD-10-CM | POA: Insufficient documentation

## 2018-09-01 DIAGNOSIS — S6992XA Unspecified injury of left wrist, hand and finger(s), initial encounter: Secondary | ICD-10-CM

## 2018-09-01 DIAGNOSIS — Z79899 Other long term (current) drug therapy: Secondary | ICD-10-CM | POA: Diagnosis not present

## 2018-09-01 DIAGNOSIS — S61211A Laceration without foreign body of left index finger without damage to nail, initial encounter: Secondary | ICD-10-CM | POA: Diagnosis present

## 2018-09-01 DIAGNOSIS — Y9389 Activity, other specified: Secondary | ICD-10-CM | POA: Insufficient documentation

## 2018-09-01 DIAGNOSIS — W230XXA Caught, crushed, jammed, or pinched between moving objects, initial encounter: Secondary | ICD-10-CM | POA: Insufficient documentation

## 2018-09-01 DIAGNOSIS — Z7982 Long term (current) use of aspirin: Secondary | ICD-10-CM | POA: Insufficient documentation

## 2018-09-01 MED ORDER — CEPHALEXIN 500 MG PO CAPS: 500.0000 mg | ORAL_CAPSULE | Freq: Four times a day (QID) | ORAL | 0 refills | Status: AC

## 2018-09-01 MED ORDER — LIDOCAINE 5 % EX PTCH: 1.0000 | MEDICATED_PATCH | CUTANEOUS | 0 refills | Status: AC

## 2018-09-01 NOTE — ED Triage Notes (Signed)
Shut third digit R hand in car door just prior to arrival.

## 2018-09-01 NOTE — ED Provider Notes (Signed)
[facility] Emergency Department Provider Note  ____________________________________________  Time seen: Approximately 7:40 PM  I have reviewed the triage vital signs and the nursing notes.   HISTORY  Chief Complaint Hand Pain    HPI Olivia Fields is a 67 y.o. female emergency department for evaluation of left index finger pain after slamming her finger in a car door this evening.  Patient also states that she fell about 2 weeks ago and has had front right hip pain on and off since.  She is not sure which direction her leg went when she fell but she fell on her buttocks.  Patient states that hip does not hurt to walk.  Pain is worse when she is standing on her feet for a long time.  Pain is worse when she lifts her leg up.  Certain positions make the pain worse.  She states that it feels like her hip pops in and out.  She has been walking and able to ambulate normally.  She has never had a hernia.  Last tetanus shot was in March.  No lower leg pain, numbness, tingling.   Past Medical History:  Diagnosis Date  . AAA (abdominal aortic aneurysm) ([place])    a. 08/2015 Abd U/S: 3.7cm AAA; b. 02/2017 CTA Abd/Pelvis: 4.4 cm infrarenal AAA.  Marland Kitchen Cancer ([place])    ovarian  . Cardiogenic shock ([place])   . Chronic diastolic CHF (congestive heart failure) ([place])    a. 02/2014 Echo: EF 50-55%, mod inf/inflat HK, diast dysfxn, mild Ao sclerosis w/o stenosis.  . CKD (chronic kidney disease), stage III ([place])   . COPD (chronic obstructive pulmonary disease) ([place])   . Coronary artery disease    a. Previous stenting of LAD, LCx and RCA at [facility];  b. 02/2014 Inf STEMI/PCI: LCX patent stent, RCA 70p, 122m/d (3.5x12, 2.75x18, 2.5x12 Xience Alpine DESs); c. 07/2017 PCI: :M nl, LAD 30p/m ISR, D1 50ost, LCX 20p/m ISR, OM2 60, OM3 40, RCA patent prox stent, 30/57m ISR (2.75x18 Resolute Onyx DES), patent distal stent, EF 45-50%, glob HK.  . H/O ovarian cancer 1990  . Hyperlipidemia   .  Hypertension   . MI (myocardial infarction) ([place]) 2002, 2009, 2015    Patient Active Problem List   Diagnosis Date Noted  . Hypotension 12/12/2017  . Renal failure 12/12/2017  . Chest pain 08/04/2017  . Drainage from left ear 12/18/2014  . Back pain 12/18/2014  . Restless leg syndrome 12/18/2014  . Otalgia 06/02/2014  . Coronary artery disease   . Hyperlipidemia   . Hypertension     Past Surgical History:  Procedure Laterality Date  . CARDIAC CATHETERIZATION  2004   [facility];x1 stent  . CARDIAC CATHETERIZATION  2006   [facility];x2 stent  . CARDIAC CATHETERIZATION  02/2014   [facility];x3 stent  . CORONARY STENT INTERVENTION N/A 08/07/2017   Procedure: CORONARY STENT INTERVENTION;  Surgeon: Wellington Hampshire, MD;  Location: [place] [facility];  Service: Cardiovascular;  Laterality: N/A;  . LEFT HEART CATH AND CORONARY ANGIOGRAPHY N/A 08/07/2017   Procedure: LEFT HEART CATH AND CORONARY ANGIOGRAPHY;  Surgeon: Wellington Hampshire, MD;  Location: [place] [facility];  Service: Cardiovascular;  Laterality: N/A;  . TOTAL ABDOMINAL HYSTERECTOMY W/ BILATERAL SALPINGOOPHORECTOMY  1990   ovarian cancer    Prior to Admission medications   Medication Sig Start Date End Date Taking? Authorizing Provider  albuterol (PROVENTIL HFA;VENTOLIN HFA) 108 (90 Base) MCG/ACT inhaler Inhale 2 puffs into the lungs every 6 (six)  hours as needed for wheezing or shortness of breath. 01/13/18   Darel Hong, MD  aspirin 81 MG tablet Take 81 mg by mouth daily.    [provider]  BRILINTA 90 MG TABS tablet TAKE 1 TABLET BY MOUTH EVERY 12 HOURS 04/30/15   Wellington Hampshire, MD  cephALEXin (KEFLEX) 500 MG capsule Take 1 capsule (500 mg total) by mouth 4 (four) times daily for 10 days. 09/01/18 09/11/18  Laban Emperor, PA-C  cetirizine (ZYRTEC) 10 MG tablet Take 10 mg by mouth daily.    [provider]  FLUoxetine (PROZAC) 40 MG capsule Take 1 capsule (40 mg total) by mouth daily. 12/18/14   Rubbie Battiest, RN   fluticasone (FLONASE) 50 MCG/ACT nasal spray Place 1 spray into both nostrils daily.    [provider]  Ipratropium-Albuterol (COMBIVENT) 20-100 MCG/ACT AERS respimat Inhale 1 puff into the lungs as needed for wheezing. Patient not taking: Reported on 12/12/2017 12/18/14   Rubbie Battiest, RN  isosorbide mononitrate (IMDUR) 30 MG 24 hr tablet Take 30 mg by mouth daily.    [provider]  lidocaine (LIDODERM) 5 % Place 1 patch onto the skin daily. Remove & Discard patch within 12 hours or as directed by MD 09/01/18   Laban Emperor, PA-C  metoprolol succinate (TOPROL-XL) 25 MG 24 hr tablet Take 1 tablet (25 mg total) by mouth daily. 12/13/17   Hillary Bow, MD  nitroGLYCERIN (NITROSTAT) 0.4 MG SL tablet Place 0.4 mg under the tongue every 5 (five) minutes as needed for chest pain.    [provider]  pregabalin (LYRICA) 150 MG capsule Take 150 mg by mouth 2 (two) times daily.    [provider]  ranitidine (ZANTAC) 150 MG tablet Take 1 tablet (150 mg total) by mouth 2 (two) times daily. 12/18/14   Rubbie Battiest, RN  rosuvastatin (CRESTOR) 40 MG tablet Take 1 tablet (40 mg total) by mouth daily at 6 PM. 08/08/17   Sainani, Belia Heman, MD    Allergies Patient has no known allergies.  Family History  Problem Relation Age of Onset  . Heart disease Mother   . Heart attack Mother   . Diabetes Mother   . Cancer Mother        Tonsils/Throat  . Heart disease Maternal Aunt   . Heart disease Maternal Aunt   . Heart disease Maternal Aunt   . Heart disease Maternal Aunt   . Heart disease Maternal Aunt   . Cancer Father        Lung  . Aneurysm Father     Social History Social History   Tobacco Use  . Smoking status: Former Smoker    Packs/day: 1.00    Years: 35.00    Pack years: 35.00    Types: Cigarettes    Last attempt to quit: 03/11/2010    Years since quitting: 8.4  . Smokeless tobacco: Never Used  . Tobacco comment: currently smoking 7-8 cigarettes/day   Substance Use Topics  . Alcohol use: Yes    Comment: rare  . Drug use: No     Review of Systems  Constitutional: No fever/chills ENT: No upper respiratory complaints. Gastrointestinal: No abdominal pain.  No nausea, no vomiting.  Musculoskeletal: Positive for hip pain.  Skin: Negative for rash. Positive for positive for abrasions, lacerations, ecchymosis.  Neurological: Negative for headaches, numbness or tingling   ____________________________________________   PHYSICAL EXAM:  VITAL SIGNS: ED Triage Vitals  Enc Vitals Group  BP 09/01/18 1834 (!) 119/47     Pulse Rate 09/01/18 1834 60     Resp 09/01/18 1834 18     Temp 09/01/18 1834 98.4 F (36.9 C)     Temp Source 09/01/18 1834 Oral     SpO2 09/01/18 1834 96 %     Weight 09/01/18 1836 160 lb (72.6 kg)     Height 09/01/18 1836 5\' 2"  (1.575 m)     Head Circumference --      Peak Flow --      Pain Score 09/01/18 1836 5     Pain Loc --      Pain Edu? --      Excl. in [place]? --      Constitutional: Alert and oriented. Well appearing and in no acute distress. Eyes: Conjunctivae are normal. PERRL. EOMI. Head: Atraumatic. ENT:      Ears:      Nose: No congestion/rhinnorhea.      Mouth/Throat: Mucous membranes are moist.  Neck: No stridor.  Cardiovascular: Normal rate, regular rhythm.  Good peripheral circulation. Respiratory: Normal respiratory effort without tachypnea or retractions. Lungs CTAB. Good air entry to the bases with no decreased or absent breath sounds. Musculoskeletal: Full range of motion to all extremities. No gross deformities appreciated.  Full range of motion of right hip.  Tenderness to palpation over proximal quadriceps.  Pain elicited with flexion of hip.  No tenderness to palpation over trochanteric bursa.  No tenderness to palpation in groin.   No bulge.  No change with vagal maneuver.  Normal gait. Neurologic:  Normal speech and language. No gross focal neurologic deficits are appreciated.   Skin:  Skin is warm, dry.  2, 1/2 centimeter abrasions to distal left third finger with surrounding ecchymosis. Psychiatric: Mood and affect are normal. Speech and behavior are normal. Patient exhibits appropriate insight and judgement.   ____________________________________________   LABS (all labs ordered are listed, but only abnormal results are displayed)  Labs Reviewed - No data to display ____________________________________________  EKG   ____________________________________________  RADIOLOGY Robinette Haines, personally viewed and evaluated these images (plain radiographs) as part of my medical decision making, as well as reviewing the written report by the radiologist.  Dg Finger Middle Right  Result Date: 09/01/2018 CLINICAL DATA:  Car door injury to the right third finger today with pain and swelling EXAM: RIGHT MIDDLE FINGER 2+V COMPARISON:  01/13/2018 right hand radiographs. FINDINGS: Soft tissue swelling in the distal right third finger. No fracture or dislocation. 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 No radiopaque foreign body in the third finger. IMPRESSION: Soft tissue swelling in the distal right third finger, with no fracture or dislocation. Electronically Signed   By: Ilona Sorrel M.D.   On: 09/01/2018 19:48   Dg Hip Unilat W Or Wo Pelvis 2-3 Views Right  Result Date: 09/01/2018 CLINICAL DATA:  Right hip pain after fall 2 weeks ago. EXAM: DG HIP (WITH OR WITHOUT PELVIS) 2-3V RIGHT COMPARISON:  None. FINDINGS: There is no evidence of hip fracture or dislocation. There is no evidence of arthropathy or other focal bone abnormality. IMPRESSION: Negative. Electronically Signed   By: Marijo Conception, M.D.   On: 09/01/2018 19:48    ____________________________________________    PROCEDURES  Procedure(s) performed:    Procedures  LACERATION REPAIR Performed by: Laban Emperor  Consent: Verbal consent obtained.  Consent given by: patient  Prepped and  Draped in normal sterile fashion  Wound explored: No foreign bodies  Laceration Location: middle finger  Laceration Length: 1/2 cm, 1/2 cm  Anesthesia: None  Local anesthetic: none  Irrigation method: syringe  Amount of cleaning: 563ml normal saline  Skin closure: dermabond  Patient tolerance: Patient tolerated the procedure well with no immediate complications.  Medications - No data to display   ____________________________________________   INITIAL IMPRESSION / ASSESSMENT AND PLAN / ED COURSE  Pertinent labs & imaging results that were available during my care of the patient were reviewed by me and considered in my medical decision making (see chart for details).  Review of the [place] CSRS was performed in accordance of the [place] prior to dispensing any controlled drugs.   Patient presents emergency department for evaluation for finger injury.  Patient also injured her hip after falling 2 weeks ago.  Symptoms are consistent with a quadriceps strain.  Finger and hip x-ray are negative.  Show finger lacerations were repaired with Dermabond.  Splint was placed.  Tetanus shot is up-to-date.  Patient will be discharged home with prescriptions for Lidoderm and Keflex. Patient is to follow up with primary care as directed. Patient is given ED precautions to return to the ED for any worsening or new symptoms.     ____________________________________________  FINAL CLINICAL IMPRESSION(S) / ED DIAGNOSES  Final diagnoses:  Injury of finger of left hand, initial encounter  Pain of right hip joint      NEW MEDICATIONS STARTED DURING THIS VISIT:  ED Discharge Orders         Ordered    cephALEXin (KEFLEX) 500 MG capsule  4 times daily     09/01/18 2058    lidocaine (LIDODERM) 5 %  Every 24 hours     09/01/18 2058              This chart was dictated using voice recognition software/Dragon. Despite best efforts to proofread, errors can occur which can change the  meaning. Any change was purely unintentional.    Laban Emperor, PA-C 09/01/18 2315    Lavonia Drafts, MD 09/01/18 [phone]

## 2018-09-01 NOTE — ED Notes (Signed)
Pt to the er for pain to the middle finger on the right hand. Pt states she slammed it in her car door and locked it. Pt has a lac to the finger with bleeding, bruising and swelling. Pt then reports pain to her right hip. Pt states she fell a while back and it felt like her hip would pop out of socket but then it stopped. No pt states she cant put it in one position.

## 2018-09-28 IMAGING — DX DG CHEST 1V PORT
1 series · 1 of 1 positions shown · non-contrast
Comparison: 08/04/2017

CLINICAL DATA: Back and chest pain

EXAM:
PORTABLE CHEST 1 VIEW

[chest ap]
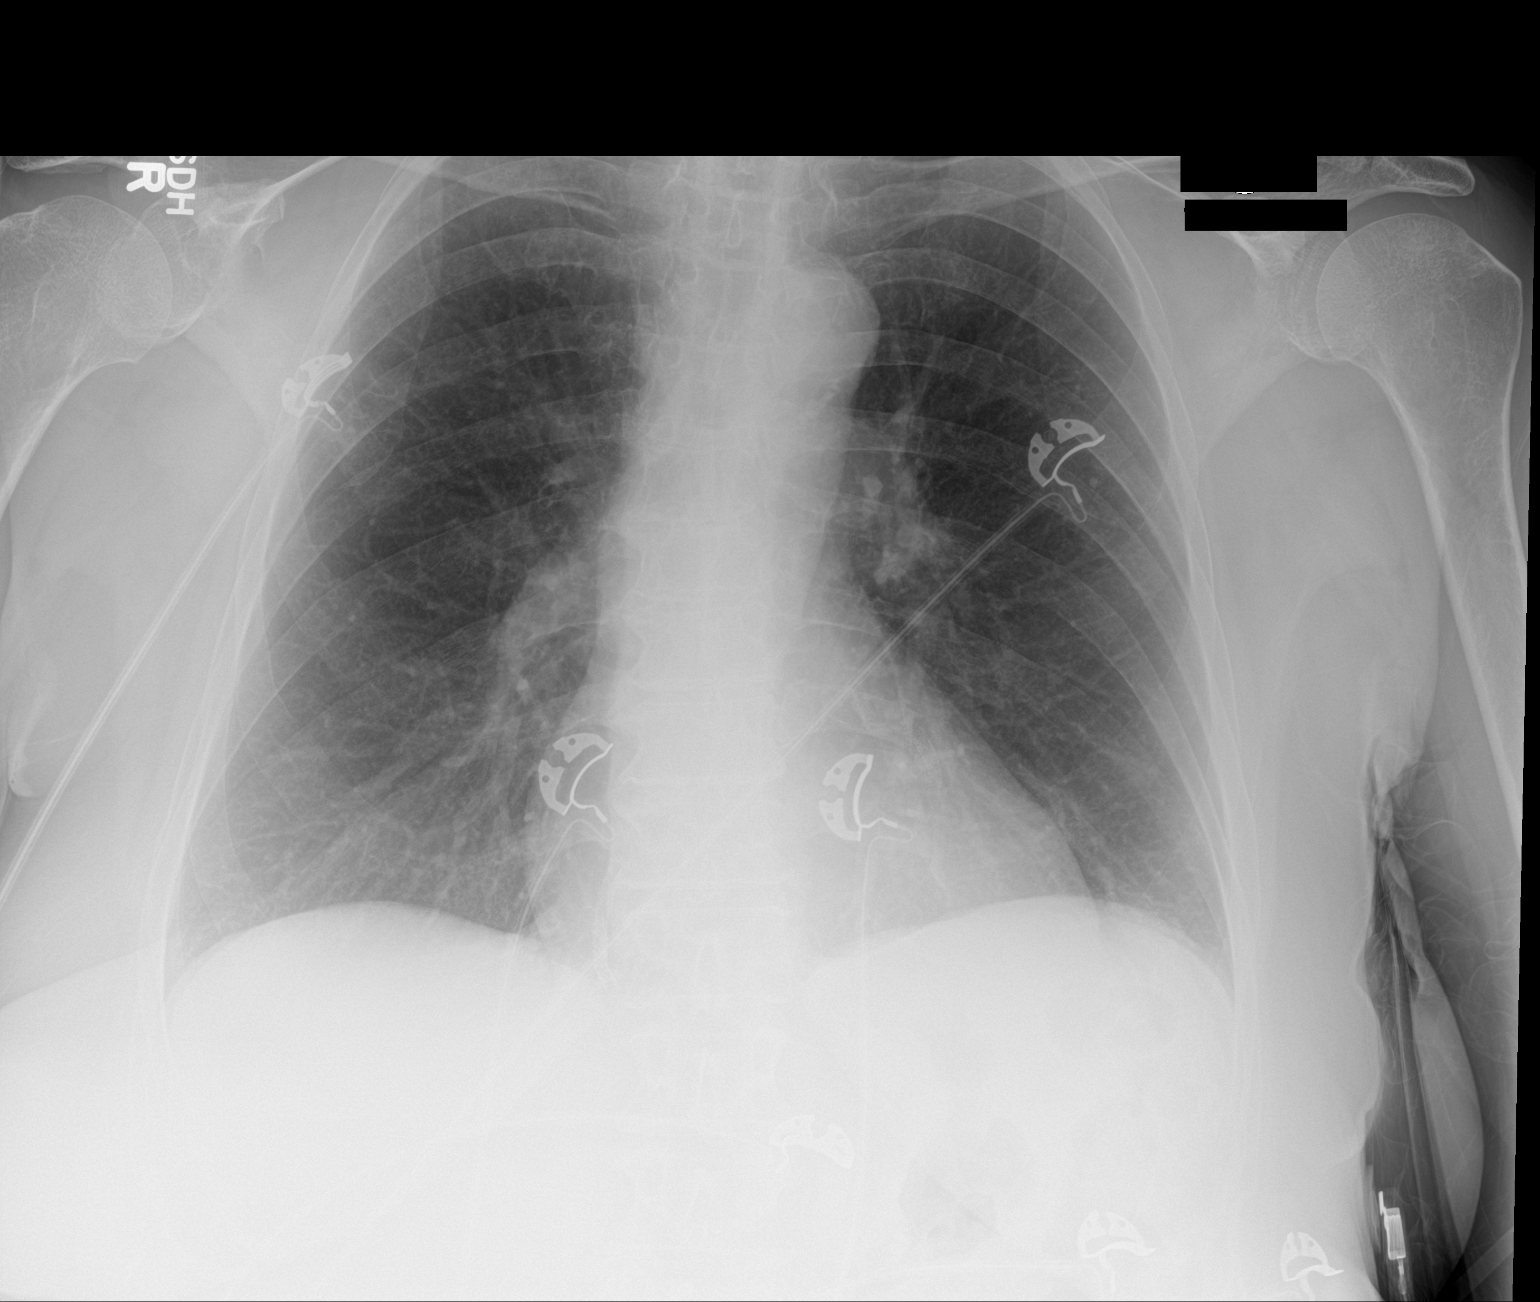

[1 of 1 positions shown; findings below may reference images not displayed]

FINDINGS: Normal heart size and mediastinal contours. Right coronary
atherosclerotic calcification. No acute infiltrate or edema. No
effusion or pneumothorax. No acute osseous findings.
IMPRESSION: No evidence of active disease.

## 2018-09-28 IMAGING — CT CT ANGIO CHEST-ABD-PELV FOR DISSECTION W/ AND WO/W CM
2 of 7 series · 13 of 46 positions shown, 15 images · IV contrast (APPLIED)
Comparison: CT scan of March 02, 2017.

CLINICAL DATA: Chest and back pain.

EXAM:
CT ANGIOGRAPHY CHEST, ABDOMEN AND PELVIS
TECHNIQUE: Multidetector CT imaging through the chest, abdomen and pelvis was
performed using the standard protocol during bolus administration of
intravenous contrast. Multiplanar reconstructed images and MIPs were
obtained and reviewed to evaluate the vascular anatomy.
CONTRAST:  75mL 6WHE1Z-RBI IOPAMIDOL (6WHE1Z-RBI) INJECTION 76%

[Series 6: axial arterial · axial · arterial · 0.81mm/px · z∈[-1090,-580]mm · 10 of 198 slices shown, 12 images]
[im 14/198  soft-tissue]
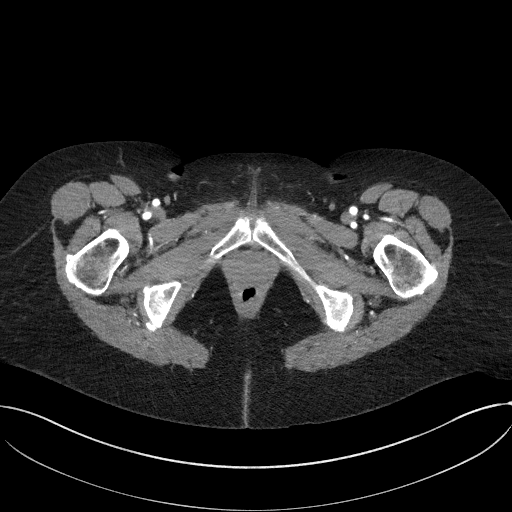
[im 14/198  bone]
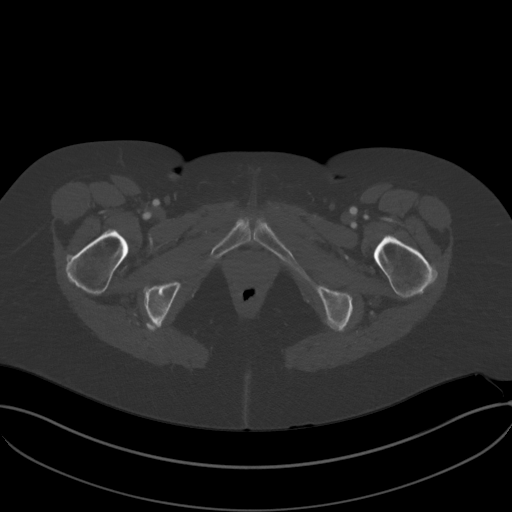
[im 40/198  soft-tissue]
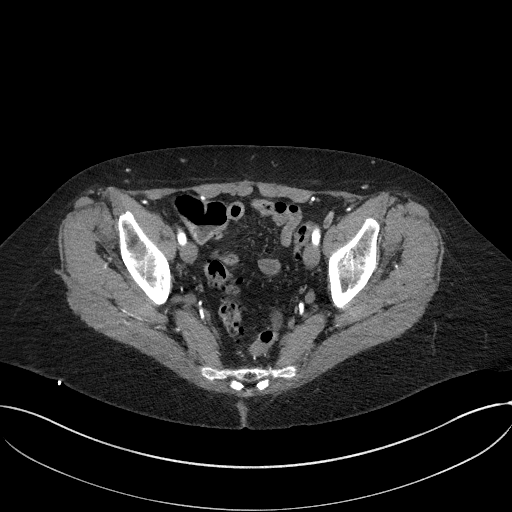
[im 53/198  soft-tissue]
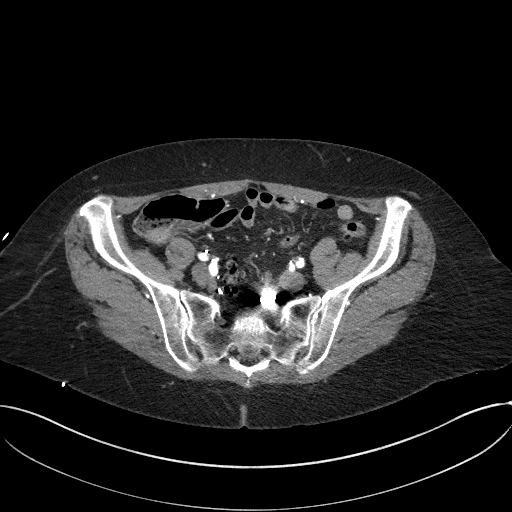
[im 66/198  soft-tissue]
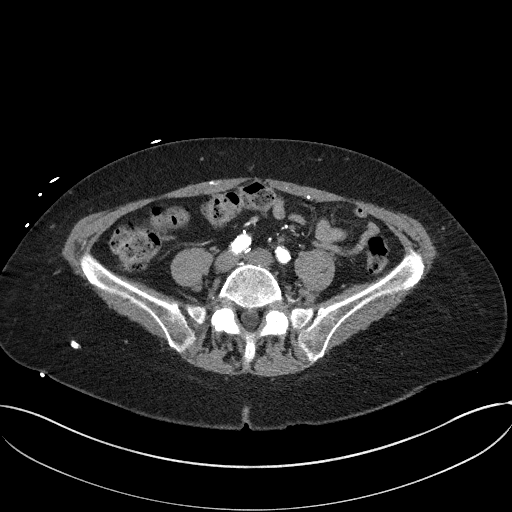
[im 92/198  soft-tissue]
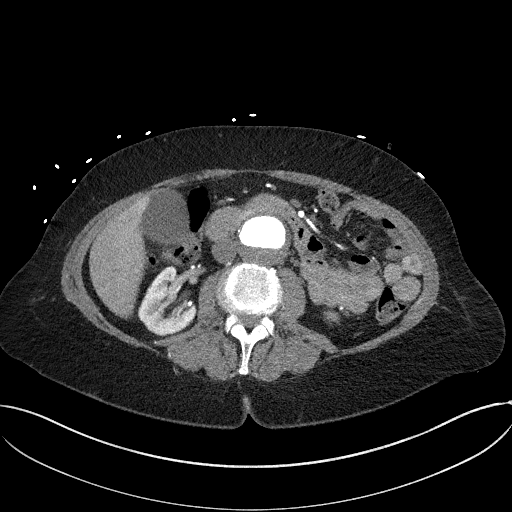
[im 106/198  soft-tissue]
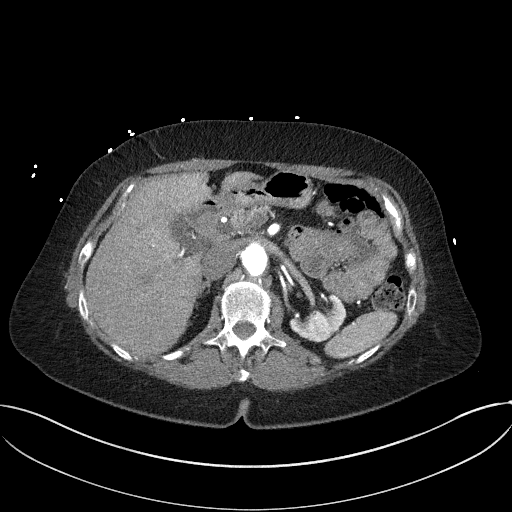
[im 132/198  soft-tissue]
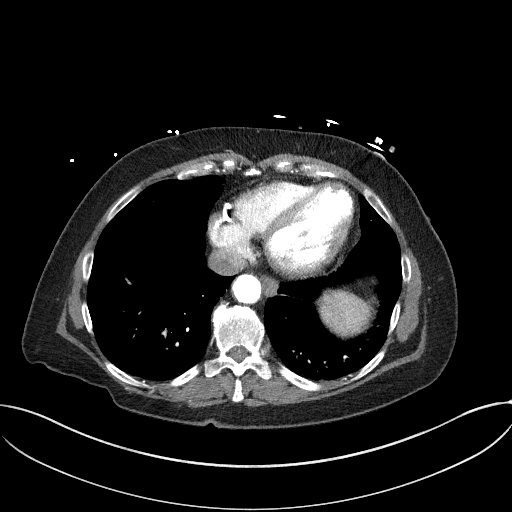
[im 145/198  soft-tissue]
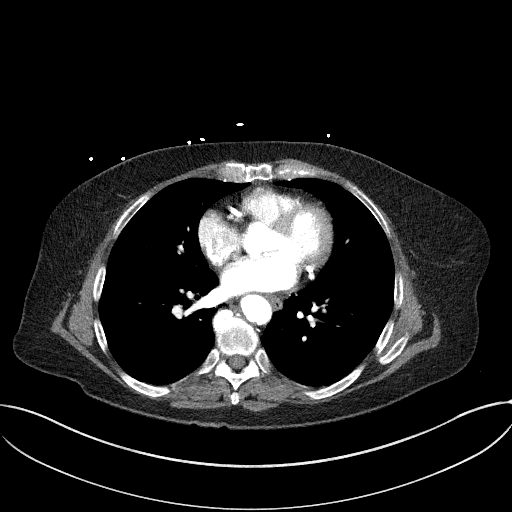
[im 158/198  soft-tissue]
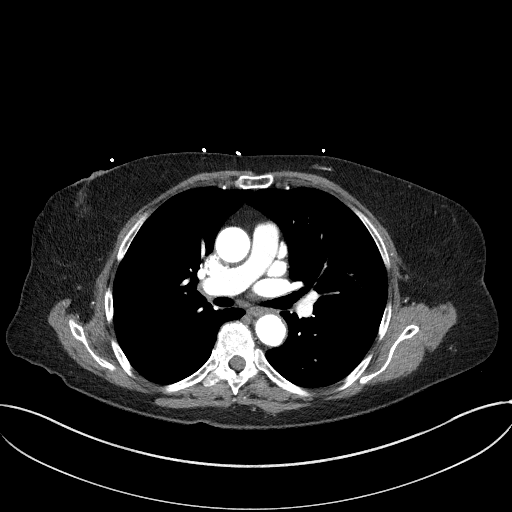
[im 158/198  bone]
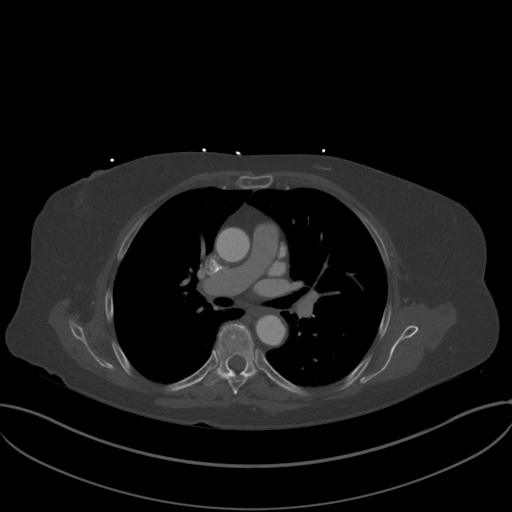
[im 184/198  soft-tissue]
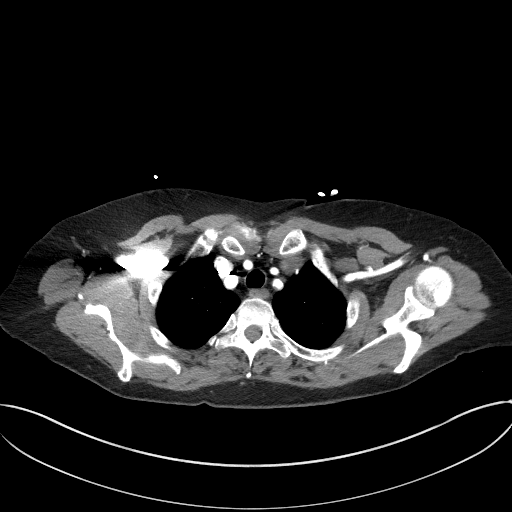

[Series 9: coronals · coronal · 0.66mm/px · 3 of 110 slices shown]
[im 28/110  soft-tissue]
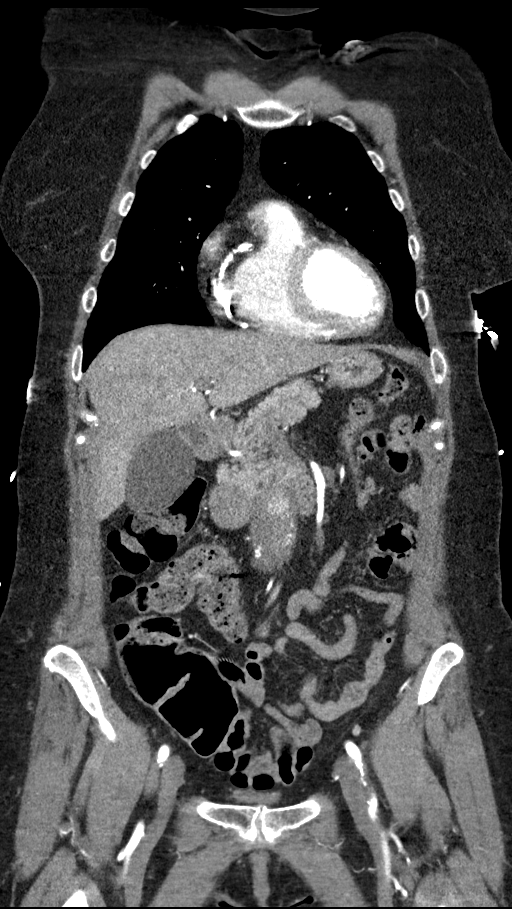
[im 55/110  soft-tissue]
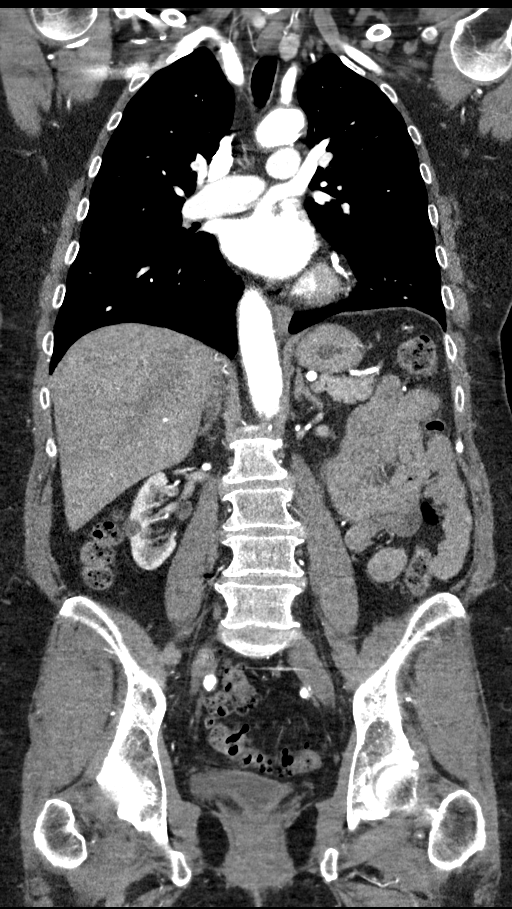
[im 82/110  soft-tissue]
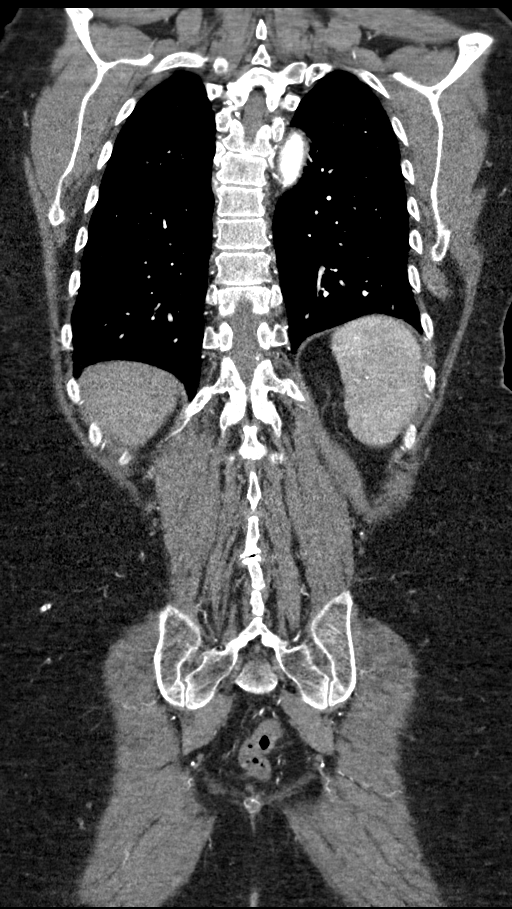

[13 of 46 positions shown; findings below may reference images not displayed]

FINDINGS: CTA CHEST FINDINGS

Cardiovascular: Atherosclerosis of thoracic aorta is noted without
aneurysm or dissection. Great vessels are widely patent. Normal
cardiac size. No pericardial effusion is noted. Coronary artery
calcifications are noted.

Mediastinum/Nodes: 1.6 cm left thyroid nodule is noted. Esophagus is
unremarkable. No significant adenopathy is noted.

Lungs/Pleura: Lungs are clear. No pleural effusion or pneumothorax.

Musculoskeletal: No chest wall abnormality. No acute or significant
osseous findings.

Review of the MIP images confirms the above findings.

CTA ABDOMEN AND PELVIS FINDINGS

VASCULAR

Aorta: 4.6 cm infrarenal abdominal aortic aneurysm is noted. No
dissection is noted. Mural thrombus is noted.

Celiac: Patent without evidence of aneurysm, dissection, vasculitis
or significant stenosis.

SMA: Patent without evidence of aneurysm, dissection, vasculitis or
significant stenosis.

Renals: Both renal arteries are patent without evidence of aneurysm,
dissection, vasculitis, fibromuscular dysplasia or significant
stenosis.

IMA: Appears to fill through retrograde flow.

Inflow: Atherosclerosis of iliac arteries is noted without
significant stenosis.

Veins: No obvious venous abnormality within the limitations of this
arterial phase study.

Review of the MIP images confirms the above findings.

NON-VASCULAR

Hepatobiliary: No focal liver abnormality is seen. No gallstones,
gallbladder wall thickening, or biliary dilatation.

Pancreas: Unremarkable. No pancreatic ductal dilatation or
surrounding inflammatory changes.

Spleen: Normal in size without focal abnormality.

Adrenals/Urinary Tract: Adrenal glands are unremarkable. Kidneys are
normal, without renal calculi, focal lesion, or hydronephrosis.
Bladder is unremarkable.

Stomach/Bowel: Stomach is within normal limits. Appendix appears
normal. No evidence of bowel wall thickening, distention, or
inflammatory changes. Sigmoid diverticulosis without inflammation.

Lymphatic: No significant adenopathy is noted.

Reproductive: Status post hysterectomy. No adnexal masses.

Other: No abdominal wall hernia or abnormality. No abdominopelvic
ascites.

Musculoskeletal: No acute or significant osseous findings.

Review of the MIP images confirms the above findings.
IMPRESSION: There is no evidence of dissection seen in the thoracic or abdominal
aorta.

Aortic atherosclerosis.

4.6 cm infrarenal abdominal aortic aneurysm is noted. Recommend
followup by abdomen and pelvis CTA in 6 months, and vascular surgery
referral/consultation if not already obtained. This recommendation
follows ACR consensus guidelines: White Paper of the ACR Incidental
Findings Committee II on Vascular Findings. [HOSPITAL] 3955;
[DATE].

Coronary artery calcifications are noted suggesting coronary artery
disease.

1.6 cm left thyroid nodule is noted. Thyroid ultrasound is
recommended for further evaluation.

Sigmoid diverticulosis is noted without inflammation.

No other abnormality seen in the abdomen or pelvis.

## 2022-10-05 ENCOUNTER — Emergency Department (HOSPITAL_COMMUNITY): Payer: Medicare Other

## 2022-10-05 ENCOUNTER — Emergency Department (HOSPITAL_COMMUNITY)
Admission: EM | Admit: 2022-10-05 | Discharge: 2022-10-05 | Disposition: A | Discharge status: Home / Self Care | Payer: Medicare Other | Attending: Student | Admitting: Student

## 2022-10-05 DIAGNOSIS — I130 Hypertensive heart and chronic kidney disease with heart failure and stage 1 through stage 4 chronic kidney disease, or unspecified chronic kidney disease: Secondary | ICD-10-CM | POA: Diagnosis not present

## 2022-10-05 DIAGNOSIS — Z8543 Personal history of malignant neoplasm of ovary: Secondary | ICD-10-CM | POA: Insufficient documentation

## 2022-10-05 DIAGNOSIS — S60022A Contusion of left index finger without damage to nail, initial encounter: Secondary | ICD-10-CM | POA: Insufficient documentation

## 2022-10-05 DIAGNOSIS — Z87891 Personal history of nicotine dependence: Secondary | ICD-10-CM | POA: Insufficient documentation

## 2022-10-05 DIAGNOSIS — N183 Chronic kidney disease, stage 3 unspecified: Secondary | ICD-10-CM | POA: Diagnosis not present

## 2022-10-05 DIAGNOSIS — W230XXA Caught, crushed, jammed, or pinched between moving objects, initial encounter: Secondary | ICD-10-CM | POA: Diagnosis not present

## 2022-10-05 DIAGNOSIS — Z79899 Other long term (current) drug therapy: Secondary | ICD-10-CM | POA: Insufficient documentation

## 2022-10-05 DIAGNOSIS — S6992XA Unspecified injury of left wrist, hand and finger(s), initial encounter: Secondary | ICD-10-CM | POA: Diagnosis present

## 2022-10-05 DIAGNOSIS — I5032 Chronic diastolic (congestive) heart failure: Secondary | ICD-10-CM | POA: Diagnosis not present

## 2022-10-05 DIAGNOSIS — J449 Chronic obstructive pulmonary disease, unspecified: Secondary | ICD-10-CM | POA: Insufficient documentation

## 2022-10-05 DIAGNOSIS — I2510 Atherosclerotic heart disease of native coronary artery without angina pectoris: Secondary | ICD-10-CM | POA: Diagnosis not present

## 2022-10-05 DIAGNOSIS — Z7982 Long term (current) use of aspirin: Secondary | ICD-10-CM | POA: Diagnosis not present

## 2022-10-05 MED ORDER — ACETAMINOPHEN 500 MG PO TABS
1000.0000 mg | ORAL_TABLET | Freq: Once | ORAL | Status: AC
  Administered 2022-10-05: 1000 mg via ORAL
  Filled 2022-10-05: qty 2

## 2022-10-05 NOTE — ED Provider Triage Note (Signed)
Emergency Medicine Provider Triage Evaluation Note  Olivia Fields , a 71 y.o. female  was evaluated in triage.  Pt complains of left index finger pain following smashing it in a car door.  Review of Systems  Positive: Finger pain Negative: No loss of sensation  Physical Exam  BP 123/81 (BP Location: Right Arm)   Pulse 73   Temp 98 F (36.7 C)   Resp 17   SpO2 98%  Gen:   Awake, no distress speaking clearly Resp:  Normal effort no increased work of breathing MSK:   Moves extremities without difficulty left index finger with full range of motion on extension, limited on flexion secondary to pain, but she does flex distally and proximally.  There is a substantial bruise on the lateral aspect of the digit Other:  Skin color changes as above  Medical Decision Making  Medically screening exam initiated at 2:07 PM.  Appropriate orders placed.  Sherlon Handing was informed that the remainder of the evaluation will be completed by another provider, this initial triage assessment does not replace that evaluation, and the importance of remaining in the ED until their evaluation is complete.   Carmin Muskrat, MD 10/05/22 [phone]

## 2022-10-05 NOTE — ED Triage Notes (Signed)
Patient here with compliant of left index finger pain after accidentally closing the finger in a car door at approximately 1330.

## 2022-10-06 NOTE — ED Provider Notes (Signed)
[facility] EMERGENCY DEPARTMENT Provider Note  CSN: [id number] Arrival date & time: 10/05/22 1348  Chief Complaint(s) Finger Injury  HPI Olivia Fields is a 71 y.o. female with PMH AAA, CHF, CKD, COPD, HTN, HLD who presents emergency department for evaluation of a finger injury.  Patient states that she slammed her left index finger in a car door and is having pain distal to the PIP joint.  She does have range of motion but there is obvious bruising noted to the finger.  Denies chest pain, shortness of breath, abdominal pain, nausea, vomiting, numbness, tingling, weakness or other systemic or traumatic complaints.   Past Medical History Past Medical History:  Diagnosis Date   AAA (abdominal aortic aneurysm) ([place])    a. 08/2015 Abd U/S: 3.7cm AAA; b. 02/2017 CTA Abd/Pelvis: 4.4 cm infrarenal AAA.   Cancer [facility])    ovarian   Cardiogenic shock (HCC)    Chronic diastolic CHF (congestive heart failure) ([place])    a. 02/2014 Echo: EF 50-55%, mod inf/inflat HK, diast dysfxn, mild Ao sclerosis w/o stenosis.   CKD (chronic kidney disease), stage III (HCC)    COPD (chronic obstructive pulmonary disease) (HCC)    Coronary artery disease    a. Previous stenting of LAD, LCx and RCA at [facility];  b. 02/2014 Inf STEMI/PCI: LCX patent stent, RCA 70p, 133md (3.5x12, 2.75x18, 2.5x12 Xience Alpine DESs); c. 07/2017 PCI: :M nl, LAD 30p/m ISR, D1 50ost, LCX 20p/m ISR, OM2 60, OM3 40, RCA patent prox stent, 30/938mSR (2.75x18 Resolute Onyx DES), patent distal stent, EF 45-50%, glob HK.   H/O ovarian cancer 1990   Hyperlipidemia    Hypertension    [place], [place] 2015   Patient Active Problem List   Diagnosis Date Noted   Hypotension 12/12/2017   Renal failure 12/12/2017   Chest pain 08/04/2017   Drainage from left ear 12/18/2014   Back pain 12/18/2014   Restless leg syndrome 12/18/2014   Otalgia 06/02/2014   Coronary artery disease    Hyperlipidemia     Hypertension    Home Medication(s) Prior to Admission medications   Medication Sig Start Date End Date Taking? Authorizing Provider  albuterol (PROVENTIL HFA;VENTOLIN HFA) 108 (90 Base) MCG/ACT inhaler Inhale 2 puffs into the lungs every 6 (six) hours as needed for wheezing or shortness of breath. 01/13/18   RiDarel HongMD  aspirin 81 MG tablet Take 81 mg by mouth daily.    [provider]  BRILINTA 90 MG TABS tablet TAKE 1 TABLET BY MOUTH EVERY 12 HOURS 04/30/15   ArWellington HampshireMD  cetirizine (ZYRTEC) 10 MG tablet Take 10 mg by mouth daily.    [provider]  FLUoxetine (PROZAC) 40 MG capsule Take 1 capsule (40 mg total) by mouth daily. 12/18/14   DoRubbie BattiestRN  fluticasone (FLONASE) 50 MCG/ACT nasal spray Place 1 spray into both nostrils daily.    [provider]  Ipratropium-Albuterol (COMBIVENT) 20-100 MCG/ACT AERS respimat Inhale 1 puff into the lungs as needed for wheezing. Patient not taking: Reported on 12/12/2017 12/18/14   DoRubbie BattiestRN  isosorbide mononitrate (IMDUR) 30 MG 24 hr tablet Take 30 mg by mouth daily.    [provider]  lidocaine (LIDODERM) 5 % Place 1 patch onto the skin daily. Remove & Discard patch within 12 hours or as directed by MD 09/01/18   WaLaban EmperorPA-C  metoprolol succinate (TOPROL-XL) 25 MG 24 hr tablet Take  1 tablet (25 mg total) by mouth daily. 12/13/17   Hillary Bow, MD  nitroGLYCERIN (NITROSTAT) 0.4 MG SL tablet Place 0.4 mg under the tongue every 5 (five) minutes as needed for chest pain.    [provider]  pregabalin (LYRICA) 150 MG capsule Take 150 mg by mouth 2 (two) times daily.    [provider]  ranitidine (ZANTAC) 150 MG tablet Take 1 tablet (150 mg total) by mouth 2 (two) times daily. 12/18/14   Rubbie Battiest, RN  rosuvastatin (CRESTOR) 40 MG tablet Take 1 tablet (40 mg total) by mouth daily at 6 PM. 08/08/17   Henreitta Leber, MD                                                                                                                                     Past Surgical History Past Surgical History:  Procedure Laterality Date   CARDIAC CATHETERIZATION  2004   [facility];x1 stent   CARDIAC CATHETERIZATION  2006   [facility];x2 stent   CARDIAC CATHETERIZATION  02/2014   [facility];x3 stent   CORONARY STENT INTERVENTION N/A 08/07/2017   Procedure: CORONARY STENT INTERVENTION;  Surgeon: Wellington Hampshire, MD;  Location: [facility];  Service: Cardiovascular;  Laterality: N/A;   LEFT HEART CATH AND CORONARY ANGIOGRAPHY N/A 08/07/2017   Procedure: LEFT HEART CATH AND CORONARY ANGIOGRAPHY;  Surgeon: Wellington Hampshire, MD;  Location: [facility];  Service: Cardiovascular;  Laterality: N/A;   TOTAL ABDOMINAL HYSTERECTOMY W/ BILATERAL SALPINGOOPHORECTOMY  1990   ovarian cancer   Family History Family History  Problem Relation Age of Onset   Heart disease Mother    Heart attack Mother    Diabetes Mother    Cancer Mother        Tonsils/Throat   Heart disease Maternal Aunt    Heart disease Maternal Aunt    Heart disease Maternal Aunt    Heart disease Maternal Aunt    Heart disease Maternal Aunt    Cancer Father        Lung   Aneurysm Father     Social History Social History   Tobacco Use   Smoking status: Former    Packs/day: 1.00    Years: 35.00    Total pack years: 35.00    Types: Cigarettes    Quit date: 03/11/2010    Years since quitting: 12.5   Smokeless tobacco: Never   Tobacco comments:    currently smoking 7-8 cigarettes/day  Vaping Use   Vaping Use: Never used  Substance Use Topics   Alcohol use: Yes    Comment: rare   Drug use: No   Allergies Patient has no known allergies.  Review of Systems Review of Systems  Musculoskeletal:  Positive for arthralgias.    Physical Exam Vital Signs  I have reviewed the triage vital signs BP (!) 109/99 (BP Location: Left Arm)  Pulse 68   Temp 97.8 F (36.6 C) (Oral)   Resp 17   SpO2  100%   Physical Exam Vitals and nursing note reviewed.  Constitutional:      General: She is not in acute distress.    Appearance: She is well-developed.  HENT:     Head: Normocephalic and atraumatic.  Eyes:     Conjunctiva/sclera: Conjunctivae normal.  Cardiovascular:     Rate and Rhythm: Normal rate and regular rhythm.     Heart sounds: No murmur heard. Pulmonary:     Effort: Pulmonary effort is normal. No respiratory distress.     Breath sounds: Normal breath sounds.  Abdominal:     Palpations: Abdomen is soft.     Tenderness: There is no abdominal tenderness.  Musculoskeletal:        General: Swelling and signs of injury present.     Cervical back: Neck supple.  Skin:    General: Skin is warm and dry.     Capillary Refill: Capillary refill takes less than 2 seconds.  Neurological:     Mental Status: She is alert.  Psychiatric:        Mood and Affect: Mood normal.     ED Results and Treatments Labs (all labs ordered are listed, but only abnormal results are displayed) Labs Reviewed - No data to display                                                                                                                        Radiology DG Finger Index Left  Result Date: 10/05/2022 CLINICAL DATA:  Slammed index finger in car door. EXAM: LEFT INDEX FINGER 2+V COMPARISON:  None Available. FINDINGS: The bones appear mildly demineralized. There is no evidence of acute fracture or dislocation. There are degenerative changes at the distal interphalangeal joint and mild soft tissue swelling. No evidence of foreign body or soft tissue emphysema. IMPRESSION: No acute osseous findings. Degenerative changes at the distal interphalangeal joint. Electronically Signed   By: Richardean Sale M.D.   On: 10/05/2022 15:04    Pertinent labs & imaging results that were available during my care of the patient were reviewed by me and considered in my medical decision making (see MDM for  details).  Medications Ordered in ED Medications  acetaminophen (TYLENOL) tablet 1,000 mg (1,000 mg Oral Given 10/05/22 1409)  Procedures Procedures  (including critical care time)  Medical Decision Making / ED Course   This patient presents to the ED for concern of finger injury, this involves an extensive number of treatment options, and is a complaint that carries with it a high risk of complications and morbidity.  The differential diagnosis includes fracture, contusion, dislocation, ligamentous injury, hematoma  MDM: Patient in the emergency room for evaluation of finger pain.  Physical exam with bruising distal to the PIP joint of the left index finger but neurologic exam is unremarkable.  X-ray imaging with no fracture.  Finger was wrapped at bedside and patient instructed to ice the finger to decrease swelling.  At this time she does not meet inpatient criteria and she is safe for outpatient follow-up.  Patient then discharged.   Additional history obtained:  -External records from outside source obtained and reviewed including: Chart review including previous notes, labs, imaging, consultation notes   Imaging Studies ordered: I ordered imaging studies including x-ray finger I independently visualized and interpreted imaging. I agree with the radiologist interpretation   Medicines ordered and prescription drug management: Meds ordered this encounter  Medications   acetaminophen (TYLENOL) tablet 1,000 mg    -I have reviewed the patients home medicines and have made adjustments as needed  Critical interventions none     Social Determinants of Health:  Factors impacting patients care include: none   Reevaluation: After the interventions noted above, I reevaluated the patient and found that they have :improved  Co morbidities that  complicate the patient evaluation  Past Medical History:  Diagnosis Date   AAA (abdominal aortic aneurysm) ([place])    a. 08/2015 Abd U/S: 3.7cm AAA; b. 02/2017 CTA Abd/Pelvis: 4.4 cm infrarenal AAA.   Cancer [facility])    ovarian   Cardiogenic shock (HCC)    Chronic diastolic CHF (congestive heart failure) ([place])    a. 02/2014 Echo: EF 50-55%, mod inf/inflat HK, diast dysfxn, mild Ao sclerosis w/o stenosis.   CKD (chronic kidney disease), stage III (HCC)    COPD (chronic obstructive pulmonary disease) (HCC)    Coronary artery disease    a. Previous stenting of LAD, LCx and RCA at [facility];  b. 02/2014 Inf STEMI/PCI: LCX patent stent, RCA 70p, 154md (3.5x12, 2.75x18, 2.5x12 Xience Alpine DESs); c. 07/2017 PCI: :M nl, LAD 30p/m ISR, D1 50ost, LCX 20p/m ISR, OM2 60, OM3 40, RCA patent prox stent, 30/985mSR (2.75x18 Resolute Onyx DES), patent distal stent, EF 45-50%, glob HK.   H/O ovarian cancer 1990   Hyperlipidemia    Hypertension    [place]) [place], [place], 2015      Dispostion: I considered admission for this patient, but she does not meet inpatient criteria and she is safe for discharge and outpatient follow-up     Final Clinical Impression(s) / ED Diagnoses Final diagnoses:  Contusion of left index finger without damage to nail, initial encounter     '@PCDICTATION'$ @    Anayansi Rundquist, MaDebe CoderMD 10/06/22 1030

## 2023-03-20 ENCOUNTER — Encounter (INDEPENDENT_AMBULATORY_CARE_PROVIDER_SITE_OTHER): Payer: 59 | Admitting: Family Medicine

## 2024-01-24 ENCOUNTER — Other Ambulatory Visit: Payer: Self-pay

## 2024-01-24 ENCOUNTER — Emergency Department (HOSPITAL_COMMUNITY)

## 2024-01-24 ENCOUNTER — Encounter (HOSPITAL_COMMUNITY): Payer: Self-pay | Admitting: *Deleted

## 2024-01-24 ENCOUNTER — Emergency Department (HOSPITAL_COMMUNITY)
Admission: EM | Admit: 2024-01-24 | Discharge: 2024-01-25 | Disposition: A | Discharge status: Home / Self Care | Attending: Emergency Medicine | Admitting: Emergency Medicine

## 2024-01-24 DIAGNOSIS — I2510 Atherosclerotic heart disease of native coronary artery without angina pectoris: Secondary | ICD-10-CM | POA: Diagnosis not present

## 2024-01-24 DIAGNOSIS — I130 Hypertensive heart and chronic kidney disease with heart failure and stage 1 through stage 4 chronic kidney disease, or unspecified chronic kidney disease: Secondary | ICD-10-CM | POA: Diagnosis not present

## 2024-01-24 DIAGNOSIS — Z7951 Long term (current) use of inhaled steroids: Secondary | ICD-10-CM | POA: Insufficient documentation

## 2024-01-24 DIAGNOSIS — J449 Chronic obstructive pulmonary disease, unspecified: Secondary | ICD-10-CM | POA: Insufficient documentation

## 2024-01-24 DIAGNOSIS — R1030 Lower abdominal pain, unspecified: Secondary | ICD-10-CM

## 2024-01-24 DIAGNOSIS — I509 Heart failure, unspecified: Secondary | ICD-10-CM | POA: Diagnosis not present

## 2024-01-24 DIAGNOSIS — Z79899 Other long term (current) drug therapy: Secondary | ICD-10-CM | POA: Insufficient documentation

## 2024-01-24 DIAGNOSIS — Z8543 Personal history of malignant neoplasm of ovary: Secondary | ICD-10-CM | POA: Diagnosis not present

## 2024-01-24 DIAGNOSIS — Z7982 Long term (current) use of aspirin: Secondary | ICD-10-CM | POA: Insufficient documentation

## 2024-01-24 DIAGNOSIS — N183 Chronic kidney disease, stage 3 unspecified: Secondary | ICD-10-CM | POA: Diagnosis not present

## 2024-01-24 DIAGNOSIS — R1032 Left lower quadrant pain: Secondary | ICD-10-CM | POA: Diagnosis present

## 2024-01-24 LAB — URINALYSIS, W/ REFLEX TO CULTURE (INFECTION SUSPECTED)
Bacteria, UA: NONE SEEN
Bilirubin Urine: NEGATIVE
Glucose, UA: NEGATIVE mg/dL
Hgb urine dipstick: NEGATIVE
Ketones, ur: NEGATIVE mg/dL
Leukocytes,Ua: NEGATIVE
Nitrite: NEGATIVE
Protein, ur: NEGATIVE mg/dL
Specific Gravity, Urine: 1.018 (ref 1.005–1.030)
pH: 5 (ref 5.0–8.0)

## 2024-01-24 LAB — COMPREHENSIVE METABOLIC PANEL
ALT: 19 U/L (ref 0–44)
AST: 32 U/L (ref 15–41)
Albumin: 3.8 g/dL (ref 3.5–5.0)
Alkaline Phosphatase: 51 U/L (ref 38–126)
Anion gap: 10 (ref 5–15)
BUN: 22 mg/dL (ref 8–23)
CO2: 21 mmol/L — ABNORMAL LOW (ref 22–32)
Calcium: 9.8 mg/dL (ref 8.9–10.3)
Chloride: 108 mmol/L (ref 98–111)
Creatinine, Ser: 2.18 mg/dL — ABNORMAL HIGH (ref 0.44–1.00)
GFR, Estimated: 24 mL/min — ABNORMAL LOW (ref >60)
Glucose, Bld: 103 mg/dL — ABNORMAL HIGH (ref 70–99)
Potassium: 4.5 mmol/L (ref 3.5–5.1)
Sodium: 139 mmol/L (ref 135–145)
Total Bilirubin: 0.6 mg/dL (ref 0.0–1.2)
Total Protein: 6.9 g/dL (ref 6.5–8.1)

## 2024-01-24 LAB — CBC
HCT: 41 % (ref 36.0–46.0)
Hemoglobin: 13.7 g/dL (ref 12.0–15.0)
MCH: 31.5 pg (ref 26.0–34.0)
MCHC: 33.4 g/dL (ref 30.0–36.0)
MCV: 94.3 fL (ref 80.0–100.0)
Platelets: 127 10*3/uL — ABNORMAL LOW (ref 150–400)
RBC: 4.35 MIL/uL (ref 3.87–5.11)
RDW: 13.2 % (ref 11.5–15.5)
WBC: 7.6 10*3/uL (ref 4.0–10.5)
nRBC: 0 % (ref 0.0–0.2)

## 2024-01-24 LAB — LIPASE, BLOOD: Lipase: 44 U/L (ref 11–51)

## 2024-01-24 NOTE — ED Provider Triage Note (Signed)
 Emergency Medicine Provider Triage Evaluation Note  Olivia Fields , a 73 y.o. female  was evaluated in triage.  Pt complains of abdominal pain.  Review of Systems  Positive:  Negative:   Physical Exam  BP 127/72   Pulse 90   Temp 98.1 F (36.7 C)   Resp 16   SpO2 100%  Gen:   Awake, no distress   Resp:  Normal effort  MSK:   Moves extremities without difficulty  Other:    Medical Decision Making  Medically screening exam initiated at 6:52 PM.  Appropriate orders placed.  Olivia Fields was informed that the remainder of the evaluation will be completed by another provider, this initial triage assessment does not replace that evaluation, and the importance of remaining in the ED until their evaluation is complete.  Left flank pain since 4AM. No other infectious symptoms. Patient stating that she cannot have contrast d/t her kidneys.   [name], [place] 01/24/24 [phone]

## 2024-01-24 NOTE — ED Triage Notes (Signed)
 Pt is here for left flank and left sided abdominal pain. Pt states that the left flank pain began at 4am and has been sharp.  Pt denies any urinary issues, denies any n/v, no fever

## 2024-01-24 NOTE — Discharge Instructions (Signed)
 Please follow-up with your primary care provider in regards to symptoms and ER visit. You have been seen in the Emergency Department (ED) for abdominal pain.  Your labs and imaging did not identify a clear cause of your symptoms but was generally reassuring. Please follow up as instructed above regarding today's emergent visit and the symptoms that are bothering you. You may take Tylenol every 6 hours as needed for pain.  Return to the ED if your abdominal pain worsens or fails to improve, you develop bloody vomiting, bloody diarrhea, you are unable to tolerate fluids due to vomiting, fever greater than 101, or other symptoms that concern you.

## 2024-01-25 MED ORDER — CYCLOBENZAPRINE HCL 10 MG PO TABS: 10.0000 mg | ORAL_TABLET | Freq: Two times a day (BID) | ORAL | 0 refills | Status: AC | PRN

## 2024-01-25 NOTE — ED Provider Notes (Signed)
 [facility] EMERGENCY DEPARTMENT AT [facility] Provider Note   CSN: [id number] Arrival date & time: 01/24/24  1758     History  Chief Complaint  Patient presents with   Flank Pain    Olivia Fields is a 73 y.o. female history of AAA repair, ovarian cancer, cardiogenic shock, CHF, CKD stage III, COPD, CAD, hyperlipidemia, hypertension presented for lower abdominal pain that began at 4 AM this morning.  Patient states that it is on her left lower quadrant radiates to her right and feels a burning sensation.  Patient denies any constipation or diarrhea nausea vomiting chest pain shortness of breath constipation diarrhea, bilateral leg weakness.  Patient does not have her appendix.  Patient denies any urinary symptoms.  Home Medications Prior to Admission medications   Medication Sig Start Date End Date Taking? Authorizing Provider  cyclobenzaprine (FLEXERIL) 10 MG tablet Take 1 tablet (10 mg total) by mouth 2 (two) times daily as needed for muscle spasms. 01/25/24  Yes Tasheem Elms, Beverly Gust, PA-C  albuterol (PROVENTIL HFA;VENTOLIN HFA) 108 (90 Base) MCG/ACT inhaler Inhale 2 puffs into the lungs every 6 (six) hours as needed for wheezing or shortness of breath. 01/13/18   Merrily Brittle, MD  aspirin 81 MG tablet Take 81 mg by mouth daily.    [provider]  BRILINTA 90 MG TABS tablet TAKE 1 TABLET BY MOUTH EVERY 12 HOURS 04/30/15   Iran Ouch, MD  cetirizine (ZYRTEC) 10 MG tablet Take 10 mg by mouth daily.    [provider]  FLUoxetine (PROZAC) 40 MG capsule Take 1 capsule (40 mg total) by mouth daily. 12/18/14   Carollee Leitz, RN  fluticasone (FLONASE) 50 MCG/ACT nasal spray Place 1 spray into both nostrils daily.    [provider]  Ipratropium-Albuterol (COMBIVENT) 20-100 MCG/ACT AERS respimat Inhale 1 puff into the lungs as needed for wheezing. Patient not taking: Reported on 12/12/2017 12/18/14   Carollee Leitz, RN  isosorbide mononitrate (IMDUR) 30  MG 24 hr tablet Take 30 mg by mouth daily.    [provider]  lidocaine (LIDODERM) 5 % Place 1 patch onto the skin daily. Remove & Discard patch within 12 hours or as directed by MD 09/01/18   Enid Derry, PA-C  metoprolol succinate (TOPROL-XL) 25 MG 24 hr tablet Take 1 tablet (25 mg total) by mouth daily. 12/13/17   Milagros Loll, MD  nitroGLYCERIN (NITROSTAT) 0.4 MG SL tablet Place 0.4 mg under the tongue every 5 (five) minutes as needed for chest pain.    [provider]  pregabalin (LYRICA) 150 MG capsule Take 150 mg by mouth 2 (two) times daily.    [provider]  ranitidine (ZANTAC) 150 MG tablet Take 1 tablet (150 mg total) by mouth 2 (two) times daily. 12/18/14   Carollee Leitz, RN  rosuvastatin (CRESTOR) 40 MG tablet Take 1 tablet (40 mg total) by mouth daily at 6 PM. 08/08/17   Sainani, Rolly Pancake, MD      Allergies    Patient has no known allergies.    Review of Systems   Review of Systems  Genitourinary:  Positive for flank pain.    Physical Exam Updated Vital Signs BP 126/85 (BP Location: Right Arm)   Pulse 86   Temp 98.2 F (36.8 C) (Oral)   Resp 14   SpO2 97%  Physical Exam Vitals reviewed.  Constitutional:      General: She is not in acute distress. HENT:  Head: Normocephalic and atraumatic.  Eyes:     Extraocular Movements: Extraocular movements intact.     Conjunctiva/sclera: Conjunctivae normal.     Pupils: Pupils are equal, round, and reactive to light.  Cardiovascular:     Rate and Rhythm: Normal rate and regular rhythm.     Pulses: Normal pulses.     Heart sounds: Normal heart sounds.     Comments: 2+ bilateral radial/dorsalis pedis pulses with regular rate Pulmonary:     Effort: Pulmonary effort is normal. No respiratory distress.     Breath sounds: Normal breath sounds.  Abdominal:     Palpations: Abdomen is soft.     Tenderness: There is abdominal tenderness (Lower abdominal). There is no right CVA tenderness, left CVA  tenderness, guarding or rebound.  Musculoskeletal:        General: Normal range of motion.     Cervical back: Normal range of motion and neck supple.     Comments: 5 out of 5 bilateral grip/leg extension strength  Skin:    General: Skin is warm and dry.     Capillary Refill: Capillary refill takes less than 2 seconds.  Neurological:     General: No focal deficit present.     Mental Status: She is alert and oriented to person, place, and time.     Comments: Sensation intact in all 4 limbs  Psychiatric:        Mood and Affect: Mood normal.     ED Results / Procedures / Treatments   Labs (all labs ordered are listed, but only abnormal results are displayed) Labs Reviewed  COMPREHENSIVE METABOLIC PANEL - Abnormal; Notable for the following components:      Result Value   CO2 21 (*)    Glucose, Bld 103 (*)    Creatinine, Ser 2.18 (*)    GFR, Estimated 24 (*)    All other components within normal limits  CBC - Abnormal; Notable for the following components:   Platelets 127 (*)    All other components within normal limits  LIPASE, BLOOD  URINALYSIS, W/ REFLEX TO CULTURE (INFECTION SUSPECTED)    EKG None  Radiology CT ABDOMEN PELVIS WO CONTRAST Result Date: 01/24/2024 CLINICAL DATA:  LLQ abdominal pain left flank and left sided abdominal pain. Pt states that the left flank pain began at 4am and has been sharp. Pt denies any urinary issues, denies any n/v, no fever EXAM: CT ABDOMEN AND PELVIS WITHOUT CONTRAST TECHNIQUE: Multidetector CT imaging of the abdomen and pelvis was performed following the standard protocol without IV contrast. RADIATION DOSE REDUCTION: This exam was performed according to the departmental dose-optimization program which includes automated exposure control, adjustment of the mA and/or kV according to patient size and/or use of iterative reconstruction technique. COMPARISON:  CT abdomen pelvis 01/31/2012 trauma in CT a abdomen pelvis 129 in FINDINGS: Lower  chest: Coronary artery calcification. Hepatobiliary: No focal liver abnormality. No gallstones, gallbladder wall thickening, or pericholecystic fluid. No biliary dilatation. Pancreas: No focal lesion. Normal pancreatic contour. No surrounding inflammatory changes. No main pancreatic ductal dilatation. Spleen: Normal in size without focal abnormality. Adrenals/Urinary Tract: No adrenal nodule bilaterally. No nephrolithiasis and no hydronephrosis.) Simple fluid cystic lesions likely represent simple renal cysts. Simple renal cysts, in the absence of clinically indicated signs/symptoms, require no independent follow-up. No ureterolithiasis or hydroureter. The urinary bladder is unremarkable. Stomach/Bowel: Stomach is within normal limits. No evidence of bowel wall thickening or dilatation. Colonic diverticulosis. Appendix appears normal. Vascular/Lymphatic: Aorto bi-iliac stent  repair. No abdominal aorta or iliac aneurysm. Moderate atherosclerotic plaque of the aorta and its branches. No abdominal, pelvic, or inguinal lymphadenopathy. Reproductive: Status post hysterectomy. No adnexal masses. Other: No intraperitoneal free fluid. No intraperitoneal free gas. No organized fluid collection. Musculoskeletal: No abdominal wall hernia or abnormality. No suspicious lytic or blastic osseous lesions. No acute displaced fracture. Multilevel degenerative changes of the spine. Partially visualized total right hip arthroplasty. IMPRESSION: 1. No acute intra-abdominal or intrapelvic abnormality. 2. Colonic diverticulosis with no acute diverticulitis. 3. Aorto bi-iliac stent repair. 4.  Aortic Atherosclerosis (ICD10-I70.0). Electronically Signed   By: Tish Frederickson M.D.   On: 01/24/2024 22:24    Procedures Procedures    Medications Ordered in ED Medications - No data to display  ED Course/ Medical Decision Making/ A&P                                 Medical Decision Making Amount and/or Complexity of Data  Reviewed Labs: ordered.  Risk Prescription drug management.   [name] 73 y.o. presented today for abdominal pain.  Working DDx that I considered at this time includes, but not limited to, gastroenteritis, colitis, small bowel obstruction, appendicitis, cholecystitis, hepatobiliary pathology, gastritis, PUD, ACS, aortic dissection, diverticulosis/diverticulitis, pancreatitis, nephrolithiasis, medication induced, AAA, UTI, pyelonephritis, ruptured ectopic pregnancy, PID, ovarian torsion.  R/o DDx: gastroenteritis, colitis, small bowel obstruction, appendicitis, cholecystitis, hepatobiliary pathology, gastritis, PUD, ACS, aortic dissection, diverticulosis/diverticulitis, pancreatitis, nephrolithiasis, medication induced, AAA, UTI, pyelonephritis, ruptured ectopic pregnancy, PID, ovarian torsion: These are considered less likely due to history of present illness, physical exam, labs/imaging findings.  Review of prior external notes: 12/18/2023 office visit  Unique Tests and My Independent Interpretation:  CBC with differential: Unremarkable CMP: Unremarkable, baseline Lipase: Unremarkable UA: Unremarkable EKG: Rate, rhythm, axis, intervals all examined and without medically relevant abnormality. ST segments without concerns for elevations CT abdomen pelvis without contrast: No acute pathology  Social Determinants of Health: none  Discussion with Independent Historian: None  Discussion of Management of Tests: None  Risk: Medium: prescription drug management  Risk Stratification Score: None  Staffed with Long, MD  Plan: On exam patient was in no acute distress stable vitals.  Patient did have some lower abdominal tenderness without peritoneal signs on exam but otherwise had reassuring physical exam.  Labs and imaging triage are all reassuring and so at this time unclear what patient's abdominal pain is from however I do not see any signs of rashes that could be indicative of  shingles either on exam.  With patient's reassuring labs imaging and physical exam I discussed with the attending we agreed the patient is safe to be discharged with primary care follow-up.  At time of discharge patient requested muscle relaxer she thinks it could be a muscle spasm which is reasonable so we will send this.  Patient was given return precautions. Patient stable for discharge at this time.  Patient verbalized understanding of plan.  This chart was dictated using voice recognition software.  Despite best efforts to proofread,  errors can occur which can change the documentation meaning.         Final Clinical Impression(s) / ED Diagnoses Final diagnoses:  Lower abdominal pain    Rx / DC Orders ED Discharge Orders          Ordered    cyclobenzaprine (FLEXERIL) 10 MG tablet  2 times daily PRN  01/25/24 0003              Netta Corrigan, PA-C 01/25/24 0007    Maia Plan, MD 01/25/24 [phone]
